# Patient Record
Sex: Female | Born: 1940 | Smoking: Former smoker
Health system: Southern US, Community
[De-identification: ages and names within clinical notes are randomized; demographics above are authoritative.]

## PROBLEM LIST (undated history)

## (undated) DIAGNOSIS — E119 Type 2 diabetes mellitus without complications: Secondary | ICD-10-CM

## (undated) DIAGNOSIS — Z860100 Personal history of colon polyps, unspecified: Secondary | ICD-10-CM

## (undated) DIAGNOSIS — T7840XA Allergy, unspecified, initial encounter: Secondary | ICD-10-CM

## (undated) DIAGNOSIS — K648 Other hemorrhoids: Secondary | ICD-10-CM

## (undated) DIAGNOSIS — K769 Liver disease, unspecified: Secondary | ICD-10-CM

## (undated) DIAGNOSIS — K5790 Diverticulosis of intestine, part unspecified, without perforation or abscess without bleeding: Secondary | ICD-10-CM

## (undated) DIAGNOSIS — K449 Diaphragmatic hernia without obstruction or gangrene: Secondary | ICD-10-CM

## (undated) DIAGNOSIS — R32 Unspecified urinary incontinence: Secondary | ICD-10-CM

## (undated) DIAGNOSIS — E0590 Thyrotoxicosis, unspecified without thyrotoxic crisis or storm: Secondary | ICD-10-CM

## (undated) DIAGNOSIS — M1990 Unspecified osteoarthritis, unspecified site: Secondary | ICD-10-CM

## (undated) DIAGNOSIS — E785 Hyperlipidemia, unspecified: Secondary | ICD-10-CM

## (undated) DIAGNOSIS — I10 Essential (primary) hypertension: Secondary | ICD-10-CM

## (undated) DIAGNOSIS — K862 Cyst of pancreas: Secondary | ICD-10-CM

## (undated) DIAGNOSIS — K219 Gastro-esophageal reflux disease without esophagitis: Secondary | ICD-10-CM

## (undated) DIAGNOSIS — E7800 Pure hypercholesterolemia, unspecified: Secondary | ICD-10-CM

## (undated) HISTORY — DX: Pure hypercholesterolemia, unspecified: E78.00

## (undated) HISTORY — DX: Allergy, unspecified, initial encounter: T78.40XA

## (undated) HISTORY — DX: Thyrotoxicosis, unspecified without thyrotoxic crisis or storm: E05.90

## (undated) HISTORY — DX: Personal history of colonic polyps: Z86.010

## (undated) HISTORY — DX: Type 2 diabetes mellitus without complications: E11.9

## (undated) HISTORY — DX: Gastro-esophageal reflux disease without esophagitis: K21.9

## (undated) HISTORY — DX: Essential (primary) hypertension: I10

## (undated) HISTORY — DX: Personal history of colon polyps, unspecified: Z86.0100

## (undated) HISTORY — DX: Liver disease, unspecified: K76.9

## (undated) HISTORY — DX: Other hemorrhoids: K64.8

## (undated) HISTORY — PX: ABDOMINAL HYSTERECTOMY: SHX81

## (undated) HISTORY — DX: Unspecified osteoarthritis, unspecified site: M19.90

## (undated) HISTORY — DX: Unspecified urinary incontinence: R32

## (undated) HISTORY — DX: Cyst of pancreas: K86.2

## (undated) HISTORY — DX: Hyperlipidemia, unspecified: E78.5

## (undated) HISTORY — DX: Diverticulosis of intestine, part unspecified, without perforation or abscess without bleeding: K57.90

## (undated) HISTORY — DX: Diaphragmatic hernia without obstruction or gangrene: K44.9

---

## 2015-07-10 ENCOUNTER — Ambulatory Visit: Payer: Self-pay | Admitting: Family Medicine

## 2015-08-03 ENCOUNTER — Other Ambulatory Visit (INDEPENDENT_AMBULATORY_CARE_PROVIDER_SITE_OTHER): Payer: Medicare Other

## 2015-08-03 ENCOUNTER — Encounter: Payer: Self-pay | Admitting: Internal Medicine

## 2015-08-03 ENCOUNTER — Ambulatory Visit (INDEPENDENT_AMBULATORY_CARE_PROVIDER_SITE_OTHER)
Admission: RE | Admit: 2015-08-03 | Discharge: 2015-08-03 | Disposition: A | Discharge status: Home / Self Care | Payer: Medicare Other | Source: Ambulatory Visit | Attending: Internal Medicine | Admitting: Internal Medicine

## 2015-08-03 ENCOUNTER — Ambulatory Visit (INDEPENDENT_AMBULATORY_CARE_PROVIDER_SITE_OTHER): Payer: Medicare Other | Admitting: Internal Medicine

## 2015-08-03 VITALS — BP 132/82 | HR 97 | Temp 98.7°F | Resp 12 | Ht 62.0 in | Wt 172.0 lb

## 2015-08-03 DIAGNOSIS — M79604 Pain in right leg: Secondary | ICD-10-CM

## 2015-08-03 DIAGNOSIS — R9389 Abnormal findings on diagnostic imaging of other specified body structures: Secondary | ICD-10-CM

## 2015-08-03 DIAGNOSIS — I10 Essential (primary) hypertension: Secondary | ICD-10-CM | POA: Diagnosis not present

## 2015-08-03 DIAGNOSIS — E785 Hyperlipidemia, unspecified: Secondary | ICD-10-CM

## 2015-08-03 DIAGNOSIS — E119 Type 2 diabetes mellitus without complications: Secondary | ICD-10-CM

## 2015-08-03 DIAGNOSIS — R938 Abnormal findings on diagnostic imaging of other specified body structures: Secondary | ICD-10-CM

## 2015-08-03 LAB — COMPREHENSIVE METABOLIC PANEL
ALT: 11 U/L (ref 0–35)
AST: 15 U/L (ref 0–37)
Albumin: 4.4 g/dL (ref 3.5–5.2)
Alkaline Phosphatase: 90 U/L (ref 39–117)
BUN: 16 mg/dL (ref 6–23)
CHLORIDE: 102 meq/L (ref 96–112)
CO2: 28 meq/L (ref 19–32)
CREATININE: 0.69 mg/dL (ref 0.40–1.20)
Calcium: 9.6 mg/dL (ref 8.4–10.5)
GFR: 87.8 mL/min (ref >60.00)
GLUCOSE: 112 mg/dL — AB (ref 70–99)
Potassium: 3.1 mEq/L — ABNORMAL LOW (ref 3.5–5.1)
SODIUM: 141 meq/L (ref 135–145)
Total Bilirubin: 0.7 mg/dL (ref 0.2–1.2)
Total Protein: 8.2 g/dL (ref 6.0–8.3)

## 2015-08-03 LAB — LIPID PANEL
CHOL/HDL RATIO: 3
Cholesterol: 156 mg/dL (ref 0–200)
HDL: 54.7 mg/dL (ref >39.00)
LDL CALC: 72 mg/dL (ref 0–99)
NonHDL: 101.13
TRIGLYCERIDES: 147 mg/dL (ref 0.0–149.0)
VLDL: 29.4 mg/dL (ref 0.0–40.0)

## 2015-08-03 LAB — HEMOGLOBIN A1C: HEMOGLOBIN A1C: 6.7 % — AB (ref 4.6–6.5)

## 2015-08-03 MED ORDER — SIMVASTATIN 20 MG PO TABS: 20.0000 mg | ORAL_TABLET | Freq: Every day | ORAL | Status: DC

## 2015-08-03 MED ORDER — AMLODIPINE BESYLATE 5 MG PO TABS: 5.0000 mg | ORAL_TABLET | Freq: Every day | ORAL | Status: DC

## 2015-08-03 MED ORDER — GLIPIZIDE ER 5 MG PO TB24
5.0000 mg | ORAL_TABLET | Freq: Every day | ORAL | Status: DC
Start: 2015-08-03 — End: 2016-04-10

## 2015-08-03 NOTE — Progress Notes (Signed)
Pre visit review using our clinic review tool, if applicable. No additional management support is needed unless otherwise documented below in the visit note. 

## 2015-08-03 NOTE — Progress Notes (Signed)
   Subjective:    Patient ID: Katrina Simmons, female    DOB: Feb 21, 1941, 75 y.o.   MRN: [id number]  HPI The patient is a new 75 YO female coming in with multiple concerns. Please see A/P for some status and treatment of chronic stable conditions. She does have a spot on her pancreas for which she gets MRI pancreas every year to ensure no growth. They have been watching it 7 years so far. Has not changed. No hypoglycemia or palpitations. She does also have right leg pain which started after a fall. She did not seek help after the fall as she was moving to the area. It hurts around the knee and also around the ankle. Able to walk on it and not swollen. Overall pain 7/10 at times. Using otc pain meds for it with limited success.   PMH, [facility], social history reviewed and updated.   Review of Systems  Constitutional: Negative for fever, chills, activity change, appetite change, fatigue and unexpected weight change.  HENT: Negative.   Eyes: Negative.   Respiratory: Negative for cough, chest tightness, shortness of breath and wheezing.   Cardiovascular: Negative for chest pain, palpitations and leg swelling.  Gastrointestinal: Negative for nausea, abdominal pain, diarrhea, constipation and abdominal distention.  Musculoskeletal: Positive for myalgias, arthralgias and gait problem.  Skin: Negative.   Neurological: Negative.   Psychiatric/Behavioral: Negative.       Objective:   Physical Exam  Constitutional: She is oriented to person, place, and time. She appears well-developed and well-nourished.  HENT:  Head: Normocephalic and atraumatic.  Eyes: EOM are normal.  Neck: Normal range of motion.  Cardiovascular: Normal rate and regular rhythm.   Pulmonary/Chest: Effort normal and breath sounds normal. No respiratory distress. She has no wheezes.  Abdominal: Soft. Bowel sounds are normal. She exhibits no distension. There is no tenderness. There is no rebound.  Musculoskeletal: She exhibits  tenderness.  Pain below the patella, also pain at the ankle and with manipulation of the ankle.   Neurological: She is alert and oriented to person, place, and time.  Skin: Skin is warm and dry.  Psychiatric: She has a normal mood and affect.   Filed Vitals:   08/03/15 0857  BP: 132/82  Pulse: 97  Temp: 98.7 F (37.1 C)  TempSrc: Oral  Resp: 12  Height:  (1.575 m)  Weight: 172 lb (78.019 kgCharlayne Vultaggio     Assessment & Plan:

## 2015-08-03 NOTE — Patient Instructions (Signed)
We will get the records and then order the imaging of the stomach.   We have done the x-rays of the ankle and knee today as well as the blood work.  We have sent in the refills and cleaned out your ears today.  Diabetes and Standards of Medical Care Diabetes is complicated. You may find that your diabetes team includes a dietitian, nurse, diabetes educator, eye doctor, and more. To help everyone know what is going on and to help you get the care you deserve, the following schedule of care was developed to help keep you on track. Below are the tests, exams, vaccines, medicines, education, and plans you will need. HbA1c test This test shows how well you have controlled your glucose over the past 2-3 months. It is used to see if your diabetes management plan needs to be adjusted.   It is performed at least 2 times a year if you are meeting treatment goals.  It is performed 4 times a year if therapy has changed or if you are not meeting treatment goals. Blood pressure test  This test is performed at every routine medical visit. The goal is less than 140/90 mm Hg for most people, but 130/80 mm Hg in some cases. Ask your health care provider about your goal. Dental exam  Follow up with the dentist regularly. Eye exam  If you are diagnosed with type 1 diabetes as a child, get an exam upon reaching the age of 50 years or older and having had diabetes for 3-5 years. Yearly eye exams are recommended after that initial eye exam.  If you are diagnosed with type 1 diabetes as an adult, get an exam within 5 years of diagnosis and then yearly.  If you are diagnosed with type 2 diabetes, get an exam as soon as possible after the diagnosis and then yearly. Foot care exam  Visual foot exams are performed at every routine medical visit. The exams check for cuts, injuries, or other problems with the feet.  You should have a complete foot exam performed every year. This exam includes an inspection of the  structure and skin of your feet, a check of the pulses in your feet, and a check of the sensation in your feet.  Type 1 diabetes: The first exam is performed 5 years after diagnosis.  Type 2 diabetes: The first exam is performed at the time of diagnosis.  Check your feet nightly for cuts, injuries, or other problems with your feet. Tell your health care provider if anything is not healing. Kidney function test (urine microalbumin)  This test is performed once a year.  Type 1 diabetes: The first test is performed 5 years after diagnosis.  Type 2 diabetes: The first test is performed at the time of diagnosis.  A serum creatinine and estimated glomerular filtration rate (eGFR) test is done once a year to assess the level of chronic kidney disease (CKD), if present. Lipid profile (cholesterol, HDL, LDL, triglycerides)  Performed every 5 years for most people.  The goal for LDL is less than 100 mg/dL. If you are at high risk, the goal is less than 70 mg/dL.  The goal for HDL is 40 mg/dL-50 mg/dL for men and 50 mg/dL-60 mg/dL for women. An HDL cholesterol of 60 mg/dL or higher gives some protection against heart disease.  The goal for triglycerides is less than 150 mg/dL. Immunizations  The flu (influenza) vaccine is recommended yearly for every person 51 months of age or  older who has diabetes.  The pneumonia (pneumococcal) vaccine is recommended for every person 56 years of age or older who has diabetes. Adults 75 years of age or older may receive the pneumonia vaccine as a series of two separate shots.  The hepatitis B vaccine is recommended for adults shortly after they have been diagnosed with diabetes.  The Tdap (tetanus, diphtheria, and pertussis) vaccine should be given:  According to normal childhood vaccination schedules, for children.  Every 10 years, for adults who have diabetes. Diabetes self-management education  Education is recommended at diagnosis and ongoing as  needed. Treatment plan  Your treatment plan is reviewed at every medical visit.   This information is not intended to replace advice given to you by your health care provider. Make sure you discuss any questions you have with your health care provider.   Document Released: 04/27/2009 Document Revised: 07/21/2014 Document Reviewed: 11/30/2012 Elsevier Interactive Patient Education [employer].

## 2015-08-05 DIAGNOSIS — E118 Type 2 diabetes mellitus with unspecified complications: Secondary | ICD-10-CM | POA: Insufficient documentation

## 2015-08-05 DIAGNOSIS — E1169 Type 2 diabetes mellitus with other specified complication: Secondary | ICD-10-CM | POA: Insufficient documentation

## 2015-08-05 DIAGNOSIS — R9389 Abnormal findings on diagnostic imaging of other specified body structures: Secondary | ICD-10-CM | POA: Insufficient documentation

## 2015-08-05 DIAGNOSIS — E119 Type 2 diabetes mellitus without complications: Secondary | ICD-10-CM

## 2015-08-05 DIAGNOSIS — E785 Hyperlipidemia, unspecified: Secondary | ICD-10-CM

## 2015-08-05 DIAGNOSIS — I10 Essential (primary) hypertension: Secondary | ICD-10-CM | POA: Insufficient documentation

## 2015-08-05 NOTE — Assessment & Plan Note (Signed)
Takes simvastatin and checking lipid panel today. Adjust as needed. No side effects.

## 2015-08-05 NOTE — Assessment & Plan Note (Signed)
Checking HgA1c, unclear why she is on glipizide and adjust as needed. Not on ACE-I, getting urine screening at next visit. Foot exam done today.

## 2015-08-05 NOTE — Assessment & Plan Note (Signed)
Will get records for full details but per her reports spot on her pancreas which is imaged annually in February. Once we get records we can order so that we have comparison. Unclear how long she should get imaging if no change. Does have diabetes.

## 2015-08-05 NOTE — Assessment & Plan Note (Signed)
BP at goal on amlodipine and checking labs today, if needed adjust. Unclear why she is not on ACE-I with her concurrent diabetes.

## 2015-08-06 ENCOUNTER — Telehealth: Payer: Self-pay

## 2015-08-07 ENCOUNTER — Encounter: Payer: Self-pay | Admitting: Internal Medicine

## 2015-08-10 ENCOUNTER — Telehealth: Payer: Self-pay | Admitting: Internal Medicine

## 2015-08-10 ENCOUNTER — Ambulatory Visit (INDEPENDENT_AMBULATORY_CARE_PROVIDER_SITE_OTHER): Payer: Medicare Other | Admitting: Internal Medicine

## 2015-08-10 ENCOUNTER — Encounter: Payer: Self-pay | Admitting: Internal Medicine

## 2015-08-10 VITALS — BP 130/70 | HR 103 | Temp 97.7°F | Resp 14 | Ht 62.0 in | Wt 177.0 lb

## 2015-08-10 DIAGNOSIS — M25569 Pain in unspecified knee: Secondary | ICD-10-CM | POA: Insufficient documentation

## 2015-08-10 DIAGNOSIS — R350 Frequency of micturition: Secondary | ICD-10-CM

## 2015-08-10 DIAGNOSIS — M25561 Pain in right knee: Secondary | ICD-10-CM

## 2015-08-10 MED ORDER — DICLOFENAC SODIUM 1 % TD GEL: 2.0000 g | Freq: Three times a day (TID) | TRANSDERMAL | Status: DC | PRN

## 2015-08-10 MED ORDER — OXYBUTYNIN CHLORIDE 5 MG PO TABS: 5.0000 mg | ORAL_TABLET | Freq: Two times a day (BID) | ORAL | Status: DC

## 2015-08-10 MED ORDER — MELOXICAM 7.5 MG PO TABS: 7.5000 mg | ORAL_TABLET | Freq: Every day | ORAL | Status: DC

## 2015-08-10 NOTE — Assessment & Plan Note (Signed)
Rx for meloxicam and voltaren gel to try for pain. Recent x-ray findings reviewed with her of arthritis. If she does not respond to NSAIDs may refer for knee injections. This may have been accelerated by her fall while moving.

## 2015-08-10 NOTE — Assessment & Plan Note (Signed)
Rx for oxybutynin to try for her symptoms. They do sound like urge incontinence. No indication for U/A today without symptoms of infection and symptoms with insidious onset.

## 2015-08-10 NOTE — Patient Instructions (Signed)
We have sent in the mobic  (meloxicam) which is a once daily medicine for arthritis pain.  We have also sent in oxybutynin for the bladder that you can take up to twice a day as needed.   Arthritis Arthritis means joint pain. It can also mean joint disease. A joint is a place where bones come together. People who have arthritis may have:  Red joints.  Swollen joints.  Stiff joints.  Warm joints.  A fever.  A feeling of being sick. HOME CARE Pay attention to any changes in your symptoms. Take these actions to help with your pain and swelling. Medicines  Take over-the-counter and prescription medicines only as told by your doctor.  Do not take aspirin for pain if your doctor says that you may have gout. Activities  Rest your joint if your doctor tells you to.  Avoid activities that make the pain worse.  Exercise your joint regularly as told by your doctor. Try doing exercises like:  Swimming.  Water aerobics.  Biking.  Walking. Joint Care  If your joint is swollen, keep it raised (elevated) if told by your doctor.  If your joint feels stiff in the morning, try taking a warm shower.  If you have diabetes, do not apply heat without asking your doctor.  If told, apply heat to the joint:  Put a towel between the joint and the hot pack or heating pad.  Leave the heat on the area for 20-30 minutes.  If told, apply ice to the joint:  Put ice in a plastic bag.  Place a towel between your skin and the bag.  Leave the ice on for 20 minutes, 2-3 times per day.  Keep all follow-up visits as told by your doctor. GET HELP IF:  The pain gets worse.  You have a fever. GET HELP RIGHT AWAY IF:  You have very bad pain in your joint.  You have swelling in your joint.  Your joint is red.  Many joints become painful and swollen.  You have very bad back pain.  Your leg is very weak.  You cannot control your pee (urine) or poop (stool).   This information is  not intended to replace advice given to you by your health care provider. Make sure you discuss any questions you have with your health care provider.   Document Released: 09/24/2009 Document Revised: 03/21/2015 Document Reviewed: 09/25/2014 Elsevier Interactive Patient Education [employer]

## 2015-08-10 NOTE — Telephone Encounter (Signed)
[facility] forward 90 pages to Dr.Crawford

## 2015-08-10 NOTE — Progress Notes (Signed)
   Subjective:    Patient ID: Katrina Simmons, female    DOB: May 11, 1941, 75 y.o.   MRN: [id number]  HPI The patient is a 75 YO female coming in for multiple concerns. She is still having the knee and ankle pain and wants something to try. She has taken naproxen which was not very helpful and bengay which was not helpful. She denies new injury or fall to the area. Rates it 4/10 and she is still able to be active.  Next concern is her urinary frequency. She is not having any burning. She sometimes does not make it to the bathroom. She has had it for several months and it is mild some days and more severe others. She is using the restroom multiple times per day and it is also waking her up at night time. No fevers or chills. No abdominal pain.  Review of Systems  Constitutional: Negative for fever, chills, activity change, appetite change, fatigue and unexpected weight change.  HENT: Negative.   Eyes: Negative.   Respiratory: Negative for cough, chest tightness, shortness of breath and wheezing.   Cardiovascular: Negative for chest pain, palpitations and leg swelling.  Gastrointestinal: Negative for nausea, abdominal pain, diarrhea, constipation and abdominal distention.  Genitourinary: Positive for urgency, frequency and enuresis. Negative for dysuria, flank pain, decreased urine volume and difficulty urinating.  Musculoskeletal: Positive for myalgias, arthralgias and gait problem.  Skin: Negative.   Neurological: Negative.   Psychiatric/Behavioral: Negative.       Objective:   Physical Exam  Constitutional: She is oriented to person, place, and time. She appears well-developed and well-nourished.  HENT:  Head: Normocephalic and atraumatic.  Eyes: EOM are normal.  Neck: Normal range of motion.  Cardiovascular: Normal rate and regular rhythm.   Pulmonary/Chest: Effort normal and breath sounds normal. No respiratory distress. She has no wheezes.  Abdominal: Soft. Bowel sounds are normal.  She exhibits no distension. There is no tenderness. There is no rebound.  Musculoskeletal: She exhibits tenderness.  Pain below the patella, also pain at the ankle and with manipulation of the ankle.   Neurological: She is alert and oriented to person, place, and time.  Skin: Skin is warm and dry.  Psychiatric: She has a normal mood and affect.   Filed Vitals:   08/10/15 0814  BP: 130/70  Pulse: 103  Temp: 97.7 F (36.5 C)  TempSrc: Oral  Resp: 14  Height:  (1.575 m)  Weight: 177 lb (80.287 kg)  SpO2: 98%      Assessment & Plan:

## 2015-08-10 NOTE — Progress Notes (Signed)
Pre visit review using our clinic review tool, if applicable. No additional management support is needed unless otherwise documented below in the visit note. 

## 2015-08-27 ENCOUNTER — Telehealth: Payer: Self-pay | Admitting: Internal Medicine

## 2015-08-27 DIAGNOSIS — K862 Cyst of pancreas: Secondary | ICD-10-CM

## 2015-08-27 NOTE — Telephone Encounter (Signed)
Notes not received. Patient will call to see what the hold up is.

## 2015-08-27 NOTE — Telephone Encounter (Signed)
Patient called to advise that her previous md office sent this information on 08/09/2015. States that they will fax it again today.

## 2015-08-27 NOTE — Telephone Encounter (Signed)
Is requesting call back.  States patient is really in need of surgery but Dr. Okey Dupre was waiting on notes from GI in [place].  Would like to know if notes have been received and what patient needs to do if not.

## 2015-08-27 NOTE — Telephone Encounter (Signed)
Pt called back to be sure we received the fax

## 2015-08-28 ENCOUNTER — Telehealth: Payer: Self-pay | Admitting: Internal Medicine

## 2015-08-28 NOTE — Telephone Encounter (Signed)
Records received. Please advise on referral for surgery, thanks.

## 2015-08-28 NOTE — Telephone Encounter (Signed)
What surgery is she waiting on? The records we were waiting on to see what if any imaging she needed for her stomach not a surgical clearance? The CT order was placed today.

## 2015-08-28 NOTE — Telephone Encounter (Signed)
Patient aware and will wait to hear about her appointment.

## 2015-08-28 NOTE — Telephone Encounter (Signed)
Rec'd from [facility] forward 90 pages to Dr. Okey Dupre

## 2015-08-29 ENCOUNTER — Telehealth: Payer: Self-pay | Admitting: Geriatric Medicine

## 2015-08-29 NOTE — Telephone Encounter (Signed)
Patient says she is supposed to have an MRI and not a CT Scan to check the spot on her pancreas. She wants you to change it. She said she called the office that is doing the procedure and they told her she usually has an MRI. Please advise, thanks.

## 2015-08-29 NOTE — Telephone Encounter (Signed)
I spoke with patient's daughter and clarified that the patient needs a CT, not an MRI.

## 2015-08-29 NOTE — Telephone Encounter (Signed)
Spoke with radiology and faxed them the report from prior imaging of the abdomen which was CT. She does not need an MRI. This is a non-emergent study and can be scheduled in the next month or so.

## 2015-08-31 ENCOUNTER — Inpatient Hospital Stay: Admission: RE | Admit: 2015-08-31 | Payer: Medicare Other | Source: Ambulatory Visit

## 2015-08-31 ENCOUNTER — Telehealth: Payer: Self-pay | Admitting: Internal Medicine

## 2015-08-31 ENCOUNTER — Telehealth: Payer: Self-pay | Admitting: Geriatric Medicine

## 2015-08-31 ENCOUNTER — Other Ambulatory Visit: Payer: Medicare Other

## 2015-08-31 DIAGNOSIS — K862 Cyst of pancreas: Secondary | ICD-10-CM

## 2015-08-31 NOTE — Telephone Encounter (Signed)
Needs to cancel CT scan.  She was referred to GI and it take a month and a half away.  States that mother thinks that's too long.  Is going to talk to mom again.  They might want to get records to go to another office. She will notify you on what she decides.

## 2015-08-31 NOTE — Telephone Encounter (Signed)
Patient's daughter said they were under the impression that after you got the records from [place] that you would refer them to a GI doctor. Her daughter is asking for a referral without another office visit. Please advise, thanks.

## 2015-08-31 NOTE — Telephone Encounter (Signed)
Left message on daughter's voice mail informing her of message below. I placed the records up front to be picked up.

## 2015-08-31 NOTE — Telephone Encounter (Signed)
Fine, would like her to pick up a copy of the records from our office to take with them to the GI visit since once they are put into the computer it takes some time for them to be available to other users. Referral placed but it could be a month or so until they can get in. Will cancel the imaging order since they do not intend to get done.

## 2015-09-03 NOTE — Telephone Encounter (Signed)
Ct was already canceled in the computer. I would advise that the month and a half away is not inappropriate as this spot has been present and unchanged now since 2009 and can wait for the appropriate imaging.

## 2015-09-05 NOTE — Telephone Encounter (Signed)
Left message for Katrina Simmons to call me back.

## 2015-09-08 DIAGNOSIS — N281 Cyst of kidney, acquired: Secondary | ICD-10-CM | POA: Diagnosis not present

## 2015-09-08 DIAGNOSIS — K7689 Other specified diseases of liver: Secondary | ICD-10-CM | POA: Diagnosis not present

## 2015-09-08 DIAGNOSIS — K8689 Other specified diseases of pancreas: Secondary | ICD-10-CM | POA: Diagnosis not present

## 2015-09-08 DIAGNOSIS — K449 Diaphragmatic hernia without obstruction or gangrene: Secondary | ICD-10-CM | POA: Diagnosis not present

## 2015-09-13 DIAGNOSIS — H2513 Age-related nuclear cataract, bilateral: Secondary | ICD-10-CM | POA: Diagnosis not present

## 2015-09-13 DIAGNOSIS — E119 Type 2 diabetes mellitus without complications: Secondary | ICD-10-CM | POA: Diagnosis not present

## 2015-09-13 DIAGNOSIS — H16223 Keratoconjunctivitis sicca, not specified as Sjogren's, bilateral: Secondary | ICD-10-CM | POA: Diagnosis not present

## 2015-09-14 DIAGNOSIS — Z86010 Personal history of colonic polyps: Secondary | ICD-10-CM | POA: Diagnosis not present

## 2015-09-14 DIAGNOSIS — E119 Type 2 diabetes mellitus without complications: Secondary | ICD-10-CM | POA: Diagnosis not present

## 2015-09-14 DIAGNOSIS — K862 Cyst of pancreas: Secondary | ICD-10-CM | POA: Diagnosis not present

## 2015-11-02 ENCOUNTER — Ambulatory Visit: Payer: Medicare Other | Admitting: Internal Medicine

## 2015-11-02 ENCOUNTER — Ambulatory Visit (INDEPENDENT_AMBULATORY_CARE_PROVIDER_SITE_OTHER): Payer: Medicare Other | Admitting: Internal Medicine

## 2015-11-02 ENCOUNTER — Encounter: Payer: Self-pay | Admitting: Internal Medicine

## 2015-11-02 VITALS — BP 134/64 | HR 99 | Temp 98.5°F | Resp 14 | Ht 62.0 in | Wt 175.0 lb

## 2015-11-02 DIAGNOSIS — M25561 Pain in right knee: Secondary | ICD-10-CM

## 2015-11-02 DIAGNOSIS — K649 Unspecified hemorrhoids: Secondary | ICD-10-CM | POA: Diagnosis not present

## 2015-11-02 MED ORDER — NAPROXEN 500 MG PO TABS: 500.0000 mg | ORAL_TABLET | Freq: Two times a day (BID) | ORAL | Status: DC

## 2015-11-02 MED ORDER — HYDROCORTISONE 2.5 % RE CREA: 1.0000 "application " | TOPICAL_CREAM | Freq: Two times a day (BID) | RECTAL | Status: AC

## 2015-11-02 NOTE — Assessment & Plan Note (Signed)
Rx for proctosol cream.

## 2015-11-02 NOTE — Patient Instructions (Signed)
We have sent in the cream for the hemorrhoids and the naprosyn for the pain. It is okay to keep using the voltaren cream as needed.   Come back in about 3 months so we can check on the diabetes and we will check your urine then to make sure the kidneys are not being affected by the diabetes.   Diabetes and Standards of Medical Care Diabetes is complicated. You may find that your diabetes team includes a dietitian, nurse, diabetes educator, eye doctor, and more. To help everyone know what is going on and to help you get the care you deserve, the following schedule of care was developed to help keep you on track. Below are the tests, exams, vaccines, medicines, education, and plans you will need. HbA1c test This test shows how well you have controlled your glucose over the past 2-3 months. It is used to see if your diabetes management plan needs to be adjusted.   It is performed at least 2 times a year if you are meeting treatment goals.  It is performed 4 times a year if therapy has changed or if you are not meeting treatment goals. Blood pressure test  This test is performed at every routine medical visit. The goal is less than 140/90 mm Hg for most people, but 130/80 mm Hg in some cases. Ask your health care provider about your goal. Dental exam  Follow up with the dentist regularly. Eye exam  If you are diagnosed with type 1 diabetes as a child, get an exam upon reaching the age of 40 years or older and having had diabetes for 3-5 years. Yearly eye exams are recommended after that initial eye exam.  If you are diagnosed with type 1 diabetes as an adult, get an exam within 5 years of diagnosis and then yearly.  If you are diagnosed with type 2 diabetes, get an exam as soon as possible after the diagnosis and then yearly. Foot care exam  Visual foot exams are performed at every routine medical visit. The exams check for cuts, injuries, or other problems with the feet.  You should have a  complete foot exam performed every year. This exam includes an inspection of the structure and skin of your feet, a check of the pulses in your feet, and a check of the sensation in your feet.  Type 1 diabetes: The first exam is performed 5 years after diagnosis.  Type 2 diabetes: The first exam is performed at the time of diagnosis.  Check your feet nightly for cuts, injuries, or other problems with your feet. Tell your health care provider if anything is not healing. Kidney function test (urine microalbumin)  This test is performed once a year.  Type 1 diabetes: The first test is performed 5 years after diagnosis.  Type 2 diabetes: The first test is performed at the time of diagnosis.  A serum creatinine and estimated glomerular filtration rate (eGFR) test is done once a year to assess the level of chronic kidney disease (CKD), if present. Lipid profile (cholesterol, HDL, LDL, triglycerides)  Performed every 5 years for most people.  The goal for LDL is less than 100 mg/dL. If you are at high risk, the goal is less than 70 mg/dL.  The goal for HDL is 40 mg/dL-50 mg/dL for men and 50 mg/dL-60 mg/dL for women. An HDL cholesterol of 60 mg/dL or higher gives some protection against heart disease.  The goal for triglycerides is less than 150 mg/dL. Immunizations  The flu (influenza) vaccine is recommended yearly for every person 60 months of age or older who has diabetes.  The pneumonia (pneumococcal) vaccine is recommended for every person 26 years of age or older who has diabetes. Adults 27 years of age or older may receive the pneumonia vaccine as a series of two separate shots.  The hepatitis B vaccine is recommended for adults shortly after they have been diagnosed with diabetes.  The Tdap (tetanus, diphtheria, and pertussis) vaccine should be given:  According to normal childhood vaccination schedules, for children.  Every 10 years, for adults who have diabetes. Diabetes  self-management education  Education is recommended at diagnosis and ongoing as needed. Treatment plan  Your treatment plan is reviewed at every medical visit.   This information is not intended to replace advice given to you by your health care provider. Make sure you discuss any questions you have with your health care provider.   Document Released: 04/27/2009 Document Revised: 07/21/2014 Document Reviewed: 11/30/2012 Elsevier Interactive Patient Education [employer].

## 2015-11-02 NOTE — Progress Notes (Signed)
Pre visit review using our clinic review tool, if applicable. No additional management support is needed unless otherwise documented below in the visit note. 

## 2015-11-02 NOTE — Assessment & Plan Note (Signed)
Rx for naproxen 500 mg BID prn.

## 2015-11-02 NOTE — Progress Notes (Signed)
   Subjective:    Patient ID: Katrina SchleinBarbara Rockers, female    DOB: Feb 11, 1941, 75 y.o.   MRN: [id number]  HPI The patient is a 75 YO female coming in for her arthritis and hemorrhoids. She did try the mobic but it bothers her stomach. She wants to go on naproxen which she has used in the past with good success. She also has hemorrhoids which are acting up now. She has used hydrocortisone cream in the past with good relief.  Review of Systems  Constitutional: Negative for fever, chills, activity change, appetite change, fatigue and unexpected weight change.  Respiratory: Negative for cough, chest tightness, shortness of breath and wheezing.   Cardiovascular: Negative for chest pain, palpitations and leg swelling.  Gastrointestinal: Negative for nausea, abdominal pain, diarrhea, constipation and abdominal distention.       Hemorrhoids  Genitourinary: Negative for dysuria, flank pain, decreased urine volume and difficulty urinating.  Musculoskeletal: Positive for myalgias, arthralgias and gait problem.  Skin: Negative.   Psychiatric/Behavioral: Negative.       Objective:   Physical Exam  Constitutional: She is oriented to person, place, and time. She appears well-developed and well-nourished.  HENT:  Head: Normocephalic and atraumatic.  Eyes: EOM are normal.  Neck: Normal range of motion.  Cardiovascular: Normal rate and regular rhythm.   Pulmonary/Chest: Effort normal and breath sounds normal. No respiratory distress. She has no wheezes.  Abdominal: Soft. Bowel sounds are normal. She exhibits no distension. There is no tenderness. There is no rebound.  Musculoskeletal: She exhibits tenderness.  Neurological: She is alert and oriented to person, place, and time.  Skin: Skin is warm and dry.  Psychiatric: She has a normal mood and affect.    Filed Vitals:   11/02/15 0805  BP: 134/64  Pulse: 99  Temp: 98.5 F (36.9 C)  TempSrc: Oral  Resp: 14  Height: 5\' 2"  (1.575 m)  Weight: 175 lb  (79.379 kg)  SpO2: 99%      Assessment & Plan:

## 2016-02-01 ENCOUNTER — Encounter: Payer: Self-pay | Admitting: Internal Medicine

## 2016-02-01 ENCOUNTER — Ambulatory Visit (INDEPENDENT_AMBULATORY_CARE_PROVIDER_SITE_OTHER): Payer: Medicare Other | Admitting: Internal Medicine

## 2016-02-01 ENCOUNTER — Other Ambulatory Visit (INDEPENDENT_AMBULATORY_CARE_PROVIDER_SITE_OTHER): Payer: Medicare Other

## 2016-02-01 VITALS — BP 142/80 | HR 66 | Temp 98.5°F | Resp 16 | Ht 62.0 in | Wt 182.0 lb

## 2016-02-01 DIAGNOSIS — J3089 Other allergic rhinitis: Secondary | ICD-10-CM

## 2016-02-01 DIAGNOSIS — I10 Essential (primary) hypertension: Secondary | ICD-10-CM

## 2016-02-01 DIAGNOSIS — E119 Type 2 diabetes mellitus without complications: Secondary | ICD-10-CM | POA: Diagnosis not present

## 2016-02-01 DIAGNOSIS — J309 Allergic rhinitis, unspecified: Secondary | ICD-10-CM | POA: Insufficient documentation

## 2016-02-01 LAB — HEMOGLOBIN A1C: HEMOGLOBIN A1C: 6.9 % — AB (ref 4.6–6.5)

## 2016-02-01 MED ORDER — NAPROXEN 500 MG PO TABS: 500.0000 mg | ORAL_TABLET | Freq: Two times a day (BID) | ORAL | Status: DC

## 2016-02-01 MED ORDER — FLUTICASONE PROPIONATE 50 MCG/ACT NA SUSP: 2.0000 | Freq: Every day | NASAL | Status: DC

## 2016-02-01 NOTE — Progress Notes (Signed)
   Subjective:    Patient ID: Katrina SchleinBarbara Goetting, female    DOB: [birth year], 75 y.o.   MRN: [id number]  HPI The patient is a 75 YO female coming in for follow up of her sugars (taking glipizide, denies low sugars, controlled without complications) and has acute complaint as well as leg swelling (off and on for years, worse in the last couple of days in her feet, no change to diet or exercise, not exercising) and new concern of sinus problems (taking nothing for it, going on for the last month off and on, pain in her ears, post-nasal drip, cough, no SOB, no fevers or chills).   Review of Systems  Constitutional: Negative for fever, chills, activity change, appetite change, fatigue and unexpected weight change.  HENT: Positive for congestion, ear pain, postnasal drip and rhinorrhea. Negative for ear discharge, sinus pressure, sore throat and trouble swallowing.   Eyes: Negative.   Respiratory: Positive for cough. Negative for chest tightness, shortness of breath and wheezing.   Cardiovascular: Positive for leg swelling. Negative for chest pain and palpitations.  Gastrointestinal: Negative for nausea, abdominal pain, diarrhea, constipation and abdominal distention.  Genitourinary: Negative for dysuria, flank pain, decreased urine volume and difficulty urinating.  Musculoskeletal: Positive for myalgias, arthralgias and gait problem.  Skin: Negative.   Neurological: Negative.   Psychiatric/Behavioral: Negative.       Objective:   Physical Exam  Constitutional: She is oriented to person, place, and time. She appears well-developed and well-nourished.  HENT:  Head: Normocephalic and atraumatic.  TMs normal bilateral, oropharynx with erythema and clear drainage  Eyes: EOM are normal.  Neck: Normal range of motion.  Cardiovascular: Normal rate and regular rhythm.   Pulmonary/Chest: Effort normal and breath sounds normal. No respiratory distress. She has no wheezes.  Abdominal: Soft. Bowel sounds  are normal. She exhibits no distension. There is no tenderness. There is no rebound.  Musculoskeletal: She exhibits edema.  1+ edema in bilateral feet  Lymphadenopathy:    She has no cervical adenopathy.  Neurological: She is alert and oriented to person, place, and time.  Skin: Skin is warm and dry.   Filed Vitals:   02/01/16 0800 02/01/16 0823  BP: 148/92 142/80  Pulse: 66   Temp: 98.5 F (36.9 C)   TempSrc: Oral   Resp: 16   Height: 5\' 2"  (1.575 m)   Weight: 182 lb (82.555 kg)   SpO2: 94%       Assessment & Plan:

## 2016-02-01 NOTE — Assessment & Plan Note (Signed)
Checking HgA1c today, taking glipizide and adjust as needed. No low sugars and not complicated, not on ACE-I or ARB.

## 2016-02-01 NOTE — Progress Notes (Signed)
Pre visit review using our clinic review tool, if applicable. No additional management support is needed unless otherwise documented below in the visit note. 

## 2016-02-01 NOTE — Assessment & Plan Note (Signed)
BP at goal on her amlodipine but she is having leg swelling which could be a side effect. She does not want to switch BP meds at this time.

## 2016-02-01 NOTE — Patient Instructions (Signed)
We have sent in flonase (fluticasone) to the pharmacy which is a nose spray for the allergies. Use 2 sprays in each nostril daily.  It is also okay to try zyrtec (cetirizine) 10 mg for the allergies which is an over the counter pill that you take daily.   We are checking the labs today. Work on decreasing the salt in your diet to help with the swelling.  DASH Eating Plan DASH stands for "Dietary Approaches to Stop Hypertension." The DASH eating plan is a healthy eating plan that has been shown to reduce high blood pressure (hypertension). Additional health benefits may include reducing the risk of type 2 diabetes mellitus, heart disease, and stroke. The DASH eating plan may also help with weight loss. WHAT DO I NEED TO KNOW ABOUT THE DASH EATING PLAN? For the DASH eating plan, you will follow these general guidelines:  Choose foods with a percent daily value for sodium of less than 5% (as listed on the food label).  Use salt-free seasonings or herbs instead of table salt or sea salt.  Check with your health care provider or pharmacist before using salt substitutes.  Eat lower-sodium products, often labeled as "lower sodium" or "no salt added."  Eat fresh foods.  Eat more vegetables, fruits, and low-fat dairy products.  Choose whole grains. Look for the word "whole" as the first word in the ingredient list.  Choose fish and skinless chicken or [place]key more often than red meat. Limit fish, poultry, and meat to 6 oz (170 g) each day.  Limit sweets, desserts, sugars, and sugary drinks.  Choose heart-healthy fats.  Limit cheese to 1 oz (28 g) per day.  Eat more home-cooked food and less restaurant, buffet, and fast food.  Limit fried foods.  Cook foods using methods other than frying.  Limit canned vegetables. If you do use them, rinse them well to decrease the sodium.  When eating at a restaurant, ask that your food be prepared with less salt, or no salt if possible. WHAT FOODS  CAN I EAT? Seek help from a dietitian for individual calorie needs. Grains Whole grain or whole wheat bread. Brown rice. Whole grain or whole wheat pasta. Quinoa, bulgur, and whole grain cereals. Low-sodium cereals. Corn or whole wheat flour tortillas. Whole grain cornbread. Whole grain crackers. Low-sodium crackers. Vegetables Fresh or frozen vegetables (raw, steamed, roasted, or grilled). Low-sodium or reduced-sodium tomato and vegetable juices. Low-sodium or reduced-sodium tomato sauce and paste. Low-sodium or reduced-sodium canned vegetables.  Fruits All fresh, canned (in natural juice), or frozen fruits. Meat and Other Protein Products Ground beef (85% or leaner), grass-fed beef, or beef trimmed of fat. Skinless chicken or [place]key. Ground chicken or [place]urkey. Pork trimmed of fat. All fish and seafood. Eggs. Dried beans, peas, or lentils. Unsalted nuts and seeds. Unsalted canned beans. Dairy Low-fat dairy products, such as skim or 1% milk, 2% or reduced-fat cheeses, low-fat ricotta or cottage cheese, or plain low-fat yogurt. Low-sodium or reduced-sodium cheeses. Fats and Oils Tub margarines without trans fats. Light or reduced-fat mayonnaise and salad dressings (reduced sodium). Avocado. Safflower, olive, or canola oils. Natural peanut or almond butter. Other Unsalted popcorn and pretzels. The items listed above may not be a complete list of recommended foods or beverages. Contact your dietitian for more options. WHAT FOODS ARE NOT RECOMMENDED? Grains White bread. White pasta. White rice. Refined cornbread. Bagels and croissants. Crackers that contain trans fat. Vegetables Creamed or fried vegetables. Vegetables in a cheese sauce. Regular canned vegetables.  Regular canned tomato sauce and paste. Regular tomato and vegetable juices. Fruits Dried fruits. Canned fruit in light or heavy syrup. Fruit juice. Meat and Other Protein Products Fatty cuts of meat. Ribs, chicken wings, bacon, sausage,  bologna, salami, chitterlings, fatback, hot dogs, bratwurst, and packaged luncheon meats. Salted nuts and seeds. Canned beans with salt. Dairy Whole or 2% milk, cream, half-and-half, and cream cheese. Whole-fat or sweetened yogurt. Full-fat cheeses or blue cheese. Nondairy creamers and whipped toppings. Processed cheese, cheese spreads, or cheese curds. Condiments Onion and garlic salt, seasoned salt, table salt, and sea salt. Canned and packaged gravies. Worcestershire sauce. Tartar sauce. Barbecue sauce. Teriyaki sauce. Soy sauce, including reduced sodium. Steak sauce. Fish sauce. Oyster sauce. Cocktail sauce. Horseradish. Ketchup and mustard. Meat flavorings and tenderizers. Bouillon cubes. Hot sauce. Tabasco sauce. Marinades. Taco seasonings. Relishes. Fats and Oils Butter, stick margarine, lard, shortening, ghee, and bacon fat. Coconut, palm kernel, or palm oils. Regular salad dressings. Other Pickles and olives. Salted popcorn and pretzels. The items listed above may not be a complete list of foods and beverages to avoid. Contact your dietitian for more information. WHERE CAN I FIND MORE INFORMATION? National Heart, Lung, and Blood Institute: travelstabloid.com   This information is not intended to replace advice given to you by your health care provider. Make sure you discuss any questions you have with your health care provider.   Document Released: 06/19/2011 Document Revised: 07/21/2014 Document Reviewed: 05/04/2013 Elsevier Interactive Patient Education [employer].

## 2016-02-01 NOTE — Assessment & Plan Note (Signed)
Rx for flonase and advised to start taking zyrtec. No indication for antibiotics or steroids on exam today.

## 2016-04-10 ENCOUNTER — Other Ambulatory Visit: Payer: Self-pay | Admitting: *Deleted

## 2016-04-10 MED ORDER — GLIPIZIDE ER 5 MG PO TB24: 5.0000 mg | ORAL_TABLET | Freq: Every day | ORAL | 1 refills | Status: DC

## 2016-04-10 MED ORDER — SIMVASTATIN 20 MG PO TABS: 20.0000 mg | ORAL_TABLET | Freq: Every day | ORAL | 1 refills | Status: DC

## 2016-04-25 ENCOUNTER — Ambulatory Visit: Payer: Medicare Other | Admitting: Internal Medicine

## 2016-04-25 ENCOUNTER — Other Ambulatory Visit (INDEPENDENT_AMBULATORY_CARE_PROVIDER_SITE_OTHER): Payer: Medicare Other

## 2016-04-25 ENCOUNTER — Encounter: Payer: Self-pay | Admitting: Internal Medicine

## 2016-04-25 ENCOUNTER — Ambulatory Visit (INDEPENDENT_AMBULATORY_CARE_PROVIDER_SITE_OTHER): Payer: Medicare Other | Admitting: Internal Medicine

## 2016-04-25 ENCOUNTER — Other Ambulatory Visit: Payer: Medicare Other

## 2016-04-25 VITALS — BP 158/78 | HR 92 | Temp 98.3°F | Resp 18 | Ht 62.0 in | Wt 180.4 lb

## 2016-04-25 DIAGNOSIS — R252 Cramp and spasm: Secondary | ICD-10-CM

## 2016-04-25 DIAGNOSIS — E119 Type 2 diabetes mellitus without complications: Secondary | ICD-10-CM | POA: Diagnosis not present

## 2016-04-25 LAB — HEMOGLOBIN A1C: Hgb A1c MFr Bld: 7 % — ABNORMAL HIGH (ref 4.6–6.5)

## 2016-04-25 NOTE — Assessment & Plan Note (Signed)
Checking HgA1c today and adjust as needed.  

## 2016-04-25 NOTE — Assessment & Plan Note (Signed)
She would like to trial off simvastatin for 2 weeks, if pain improved she will maintain off for 3 months and recheck lipid at visit. If no change to pain she will go back on the simvastatin. Reviewed old labs and no indication for change.

## 2016-04-25 NOTE — Progress Notes (Signed)
   Subjective:    Patient ID: Katrina SchleinBarbara Simmons, female    DOB: [birth year], 75 y.o.   MRN: [id number]  HPI The patient is a 75 YO female coming in for pains in her legs. She has heard from a friend that her cholesterol medicine can be causing some of her leg pain. She is having muscle pains in her legs most days. These are slightly worse from last visit. No change to diet or exercise since last visit. She has tried mustard over the counter and tylenol without good relief.   Review of Systems  Constitutional: Negative for activity change, appetite change, fatigue, fever and unexpected weight change.  Respiratory: Negative.   Cardiovascular: Negative.   Gastrointestinal: Negative.   Musculoskeletal: Positive for arthralgias and myalgias. Negative for back pain, gait problem and joint swelling.  Skin: Negative.   Neurological: Negative for dizziness, weakness, light-headedness and headaches.      Objective:   Physical Exam  Constitutional: She appears well-developed and well-nourished.  HENT:  Head: Normocephalic and atraumatic.  Eyes: EOM are normal.  Cardiovascular: Normal rate and regular rhythm.   Pulmonary/Chest: Effort normal. No respiratory distress. She has no wheezes. She has no rales.  Abdominal: Soft. She exhibits no distension. There is no tenderness.  Musculoskeletal: She exhibits no edema.  Mild pedal edema bilateral, unchanged from prior exam  Skin: Skin is warm and dry.   Vitals:   04/25/16 1529  BP: (!) 158/78  Pulse: 92  Resp: 18  Temp: 98.3 F (36.8 C)  TempSrc: Oral  SpO2: 98%  Weight: 180 lb 6.4 oz (81.8 kg)  Height: 5\' 2"  (1.575 m)      Assessment & Plan:

## 2016-04-25 NOTE — Patient Instructions (Addendum)
Stop taking the simvastatin medicine (also called zocor) for the next 2 weeks to see if your legs feel better.   If they feel better stay off the medicine until next visit.   If they do not feel any different starting taking the medicine again.   Don't forget to get the flu shot.

## 2016-04-25 NOTE — Progress Notes (Signed)
Pre visit review using our clinic review tool, if applicable. No additional management support is needed unless otherwise documented below in the visit note. 

## 2016-07-10 DIAGNOSIS — Z1231 Encounter for screening mammogram for malignant neoplasm of breast: Secondary | ICD-10-CM | POA: Diagnosis not present

## 2016-07-10 LAB — HM MAMMOGRAPHY

## 2016-07-11 ENCOUNTER — Encounter: Payer: Self-pay | Admitting: Internal Medicine

## 2016-07-11 NOTE — Progress Notes (Unsigned)
Results entered and sent to scan  

## 2016-07-16 ENCOUNTER — Other Ambulatory Visit: Payer: Self-pay | Admitting: Internal Medicine

## 2016-08-01 ENCOUNTER — Ambulatory Visit: Payer: Medicare Other | Admitting: Internal Medicine

## 2016-08-04 DIAGNOSIS — H04123 Dry eye syndrome of bilateral lacrimal glands: Secondary | ICD-10-CM | POA: Diagnosis not present

## 2016-08-12 DIAGNOSIS — H524 Presbyopia: Secondary | ICD-10-CM | POA: Diagnosis not present

## 2016-08-21 ENCOUNTER — Ambulatory Visit (INDEPENDENT_AMBULATORY_CARE_PROVIDER_SITE_OTHER): Payer: Medicare Other | Admitting: Internal Medicine

## 2016-08-21 ENCOUNTER — Encounter: Payer: Self-pay | Admitting: Internal Medicine

## 2016-08-21 DIAGNOSIS — H60393 Other infective otitis externa, bilateral: Secondary | ICD-10-CM | POA: Diagnosis not present

## 2016-08-21 DIAGNOSIS — H6090 Unspecified otitis externa, unspecified ear: Secondary | ICD-10-CM | POA: Insufficient documentation

## 2016-08-21 MED ORDER — FLUTICASONE PROPIONATE 50 MCG/ACT NA SUSP: 2.0000 | Freq: Every day | NASAL | 6 refills | Status: DC

## 2016-08-21 MED ORDER — NEOMYCIN-POLYMYXIN-HC 3.5-10000-1 OT SUSP: 3.0000 [drp] | Freq: Three times a day (TID) | OTIC | 0 refills | Status: DC

## 2016-08-21 NOTE — Patient Instructions (Signed)
We have sent in the ear drops to use. Use 3 drops in the ears 3 times per day for 4-5 days.   We have refilled the nose spray to keep using as well.

## 2016-08-21 NOTE — Progress Notes (Signed)
   Subjective:    Patient ID: Katrina SchleinBarbara Deland, female    DOB: 06/20/1941, 76 y.o.   MRN: [id number]  HPI The patient is a 76 YO female coming in for sinus pressure and drainage. Going on for about 1 week now. Not coughing much anymore. She is having some pain and pressure in her ears. She denies fevers or chills. Denies sick contacts. Mild sore throat. Some nasal drainage and she thinks she is using her nose spray. Overall stable to worsening with the ears.   Review of Systems  Constitutional: Negative for activity change, appetite change, chills, fatigue, fever and unexpected weight change.  HENT: Positive for congestion, ear discharge, ear pain, postnasal drip, rhinorrhea and sore throat. Negative for drooling, sinus pain, sinus pressure and trouble swallowing.   Eyes: Negative.   Respiratory: Positive for cough. Negative for chest tightness, shortness of breath and wheezing.   Cardiovascular: Negative.   Gastrointestinal: Negative.   Musculoskeletal: Negative.       Objective:   Physical Exam  Constitutional: She is oriented to person, place, and time. She appears well-developed and well-nourished. No distress.  HENT:  Head: Normocephalic and atraumatic.  Nose: Nose normal.  Otitis externa left and right, nose without crusting, oropharynx with redness and clear drainage.   Eyes: EOM are normal.  Neck: Normal range of motion. No JVD present.  Cardiovascular: Normal rate and regular rhythm.   Pulmonary/Chest: Effort normal and breath sounds normal. No respiratory distress. She has no wheezes. She has no rales.  Abdominal: Soft.  Lymphadenopathy:    She has no cervical adenopathy.  Neurological: She is alert and oriented to person, place, and time.  Skin: Skin is warm and dry.   Vitals:   08/21/16 1558  BP: (!) 158/90  Pulse: (!) 110  Temp: 98.3 F (36.8 C)  TempSrc: Oral  SpO2: 99%  Weight: 181 lb (82.1 kg)  Height: 5\' 2"  (1.575 m)      Assessment & Plan:

## 2016-08-21 NOTE — Assessment & Plan Note (Signed)
Rx for cortisporin ear drops. She will keep taking flonase which is refilled today and can add zyrtec for extra relief.

## 2016-08-21 NOTE — Progress Notes (Signed)
Pre visit review using our clinic review tool, if applicable. No additional management support is needed unless otherwise documented below in the visit note. 

## 2016-09-29 ENCOUNTER — Other Ambulatory Visit: Payer: Self-pay | Admitting: Internal Medicine

## 2016-10-01 ENCOUNTER — Telehealth: Payer: Self-pay | Admitting: Gastroenterology

## 2016-10-03 ENCOUNTER — Encounter: Payer: Self-pay | Admitting: Gastroenterology

## 2016-10-03 NOTE — Telephone Encounter (Signed)
Dr.Jacobs reviewed records and accepted patient for office visit to discuss colon

## 2016-10-31 DIAGNOSIS — R933 Abnormal findings on diagnostic imaging of other parts of digestive tract: Secondary | ICD-10-CM | POA: Diagnosis not present

## 2016-10-31 DIAGNOSIS — K862 Cyst of pancreas: Secondary | ICD-10-CM | POA: Diagnosis not present

## 2016-10-31 DIAGNOSIS — Z86010 Personal history of colonic polyps: Secondary | ICD-10-CM | POA: Diagnosis not present

## 2016-10-31 DIAGNOSIS — R1909 Other intra-abdominal and pelvic swelling, mass and lump: Secondary | ICD-10-CM | POA: Diagnosis not present

## 2016-11-11 ENCOUNTER — Encounter: Payer: Self-pay | Admitting: Gastroenterology

## 2016-11-11 ENCOUNTER — Ambulatory Visit (INDEPENDENT_AMBULATORY_CARE_PROVIDER_SITE_OTHER): Payer: Medicare Other | Admitting: Gastroenterology

## 2016-11-11 ENCOUNTER — Encounter (INDEPENDENT_AMBULATORY_CARE_PROVIDER_SITE_OTHER): Payer: Self-pay

## 2016-11-11 VITALS — BP 162/80 | HR 80 | Ht 62.0 in | Wt 178.6 lb

## 2016-11-11 DIAGNOSIS — Z86010 Personal history of colonic polyps: Secondary | ICD-10-CM

## 2016-11-11 NOTE — Patient Instructions (Signed)
We will get records sent from your previous gastroenterologist (in [place]) for review.  We already have the 2013 colonoscopy report, need previous colonoscopy reports and any associated pathology.

## 2016-11-11 NOTE — Progress Notes (Signed)
HPI: This is a  very pleasant 76 year old woman  who was referred to me by Myrlene Broker, *  to evaluate  personal history of colon polyps .    Chief complaint is personal history of colon polyps  She feels fine today. She is having no overt GI bleeding, no change in bowels, no overt weight loss.  She tells me she has had polyps in the past and he used to get colonoscopy about every 5 years.  Old Data Reviewed:  She had a colonoscopy in [place] February 2013 for "abdominal pain, history of polyps". This found diverticulosis and hemorrhoids. She was recommended to have repeat colonoscopy at five-year interval do to history of polyps. We do not have any records of previous polyp pathology here or any other previous colonoscopy reports.    Review of systems: Pertinent positive and negative review of systems were noted in the above HPI section. Complete review of systems was performed and was otherwise normal.   Past Medical History:  Diagnosis Date  . Allergy   . Arthritis   . Diabetes mellitus without complication (HCC)   . Diverticulosis   . GERD (gastroesophageal reflux disease)   . Hiatal hernia   . History of colon polyps   . Hypercholesteremia   . Hyperlipidemia   . Hypertension   . Hyperthyroidism   . Internal hemorrhoids   . Lesion of right lobe of liver   . Pancreatic cyst   . Urinary incontinence     Past Surgical History:  Procedure Laterality Date  . ABDOMINAL HYSTERECTOMY      Current Outpatient Prescriptions  Medication Sig Dispense Refill  . amLODipine (NORVASC) 5 MG tablet Take 1 tablet (5 mg total) by mouth daily. 90 tablet 1  . aspirin 81 MG tablet Take 81 mg by mouth daily.    . Cholecalciferol (D-3-5) 5000 units capsule Take 5,000 Units by mouth daily.    . diclofenac sodium (VOLTAREN) 1 % GEL Apply 2 g topically 3 (three) times daily as needed. 100 g 2  . fluticasone (FLONASE) 50 MCG/ACT nasal spray Place 2 sprays into both  nostrils daily. 16 g 6  . GLIPIZIDE XL 5 MG 24 hr tablet TAKE ONE TABLET BY MOUTH ONCE DAILY WITH  BREAKFAST 90 tablet 1  . hydrocortisone (PROCTOSOL HC) 2.5 % rectal cream Place 1 application rectally 2 (two) times daily. 30 g 11  . Menthol, Topical Analgesic, 2.5 % GEL Apply topically.    . naproxen (NAPROSYN) 500 MG tablet Take 1 tablet (500 mg total) by mouth 2 (two) times daily with a meal. 60 tablet 3  . oxybutynin (DITROPAN) 5 MG tablet Take 1 tablet (5 mg total) by mouth 2 (two) times daily. 60 tablet 3  . simvastatin (ZOCOR) 20 MG tablet Take 1 tablet (20 mg total) by mouth daily at 6 PM. 90 tablet 1  . ALREX 0.2 % SUSP as directed.  1   No current facility-administered medications for this visit.     Allergies as of 11/11/2016  . (No Known Allergies)    Family History  Problem Relation Age of Onset  . Stroke Mother   . Hypertension Mother   . Diabetes Mother   . Cancer Father     lung and prostate    Social History   Social History  . Marital status: Divorced    Spouse name: N/A  . Number of children: N/A  . Years of education: N/A   Occupational History  .  Not on file.   Social History Main Topics  . Smoking status: Former Smoker  . Smokeless tobacco: Never Used  . Alcohol use No  . Drug use: No  . Sexual activity: Not on file   Other Topics Concern  . Not on file   Social History Narrative  . No narrative on file     Physical Exam: BP (!) 162/80   Pulse 80   Ht  (1.575 m)   Wt 178 lb 9.6 oz (81 kg)   BMI 32.67 kg/m  Constitutional: generally well-appearing Psychiatric: alert and oriented x3 Eyes: extraocular movements intact Mouth: oral pharynx moist, no lesions Neck: supple no lymphadenopathy Cardiovascular: heart regular rate and rhythm Lungs: clear to auscultation bilaterally Abdomen: soft, nontender, nondistended, no obvious ascites, no peritoneal signs, normal bowel sounds Extremities: no lower extremity edema bilaterally Skin:  no lesions on visible extremities   Assessment and plan: 76 y.o. female with  Personal history of colon polyps  We discussed colon cancer screening, polyp surveillance guidelines and the evolution of those. I asked that we get records from her previous 2 or 3 colonoscopies before deciding on timing of her next colon polyp surveillance, colon cancer screening examination. Seems like she and her daughter understand the nature of the guidelines and when I get the records I will contact him with my recommendations. I see no reason for any further blood tests or imaging studies at this point   Please see the "Patient Instructions" section for addition details about the plan.   Rob Bunting, MD [facility] 11/11/2016, Games developer2 AM  Cc: Myrlene Broker, *

## 2016-11-14 DIAGNOSIS — K862 Cyst of pancreas: Secondary | ICD-10-CM | POA: Diagnosis not present

## 2016-11-14 DIAGNOSIS — E119 Type 2 diabetes mellitus without complications: Secondary | ICD-10-CM | POA: Diagnosis not present

## 2016-11-14 DIAGNOSIS — Z86010 Personal history of colonic polyps: Secondary | ICD-10-CM | POA: Diagnosis not present

## 2016-12-02 ENCOUNTER — Telehealth: Payer: Self-pay | Admitting: Gastroenterology

## 2016-12-02 NOTE — Telephone Encounter (Signed)
[facility] Dr. Richardson Do[facility]. Indications screening colonoscopy. Findings diverticulosis, internal hemorrhoid, 7 mm sigmoid polyp. 11 mm transverse colon polyp. Fair prep. 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  [place], Dr. Moses MannersJack Cole. Indications rectal and anal bleeding. Findings mild diverticulosis and internal hemorrhoids. She was recommended to have repeat colonoscopy in 5 years due to "patient being at higher risk of polyps that average"   [facility] [place] [facility] for "abdominal pain, history of polyps". This found diverticulosis and hemorrhoids. She was recommended to have repeat colonoscopy at five-year interval do to history of polyps. We do not have any records of previous polyp pathology here or any other previous colonoscopy reports.\     Please let her know that I received her previous colonoscopy reports and it does look like she had at least one polyp that was greater than 1 cm in the past. I recommend that she have repeat colonoscopy at this point for personal history of adenomatous colon polyps.

## 2016-12-03 NOTE — Telephone Encounter (Signed)
No voice mail no answer will try later

## 2016-12-04 NOTE — Telephone Encounter (Signed)
No answer and no voicemail. Letter mailed.

## 2017-01-07 ENCOUNTER — Other Ambulatory Visit: Payer: Self-pay | Admitting: Internal Medicine

## 2017-01-15 ENCOUNTER — Other Ambulatory Visit: Payer: Self-pay | Admitting: Internal Medicine

## 2017-03-30 ENCOUNTER — Telehealth: Payer: Self-pay | Admitting: Internal Medicine

## 2017-03-30 MED ORDER — SIMVASTATIN 20 MG PO TABS: ORAL_TABLET | ORAL | 0 refills | Status: DC

## 2017-03-30 MED ORDER — GLIPIZIDE ER 5 MG PO TB24: ORAL_TABLET | ORAL | 0 refills | Status: DC

## 2017-03-30 MED ORDER — AMLODIPINE BESYLATE 5 MG PO TABS: 5.0000 mg | ORAL_TABLET | Freq: Every day | ORAL | 0 refills | Status: DC

## 2017-03-30 NOTE — Telephone Encounter (Signed)
Pt called for a refill of her  amLODipine GLIPIZIDE XL 5 MG  simvastatin (ZOCOR) 20 MG tablet  CPE set up for 10/15 Please advise

## 2017-03-30 NOTE — Telephone Encounter (Signed)
Per office policy sent 30 day to local pharmacy until appt.../lmb  

## 2017-04-27 ENCOUNTER — Ambulatory Visit (INDEPENDENT_AMBULATORY_CARE_PROVIDER_SITE_OTHER): Payer: Medicare Other | Admitting: Internal Medicine

## 2017-04-27 ENCOUNTER — Encounter: Payer: Self-pay | Admitting: Internal Medicine

## 2017-04-27 ENCOUNTER — Ambulatory Visit (INDEPENDENT_AMBULATORY_CARE_PROVIDER_SITE_OTHER)
Admission: RE | Admit: 2017-04-27 | Discharge: 2017-04-27 | Disposition: A | Discharge status: Home / Self Care | Payer: Medicare Other | Source: Ambulatory Visit | Attending: Internal Medicine | Admitting: Internal Medicine

## 2017-04-27 ENCOUNTER — Other Ambulatory Visit (INDEPENDENT_AMBULATORY_CARE_PROVIDER_SITE_OTHER): Payer: Medicare Other

## 2017-04-27 VITALS — BP 130/70 | HR 100 | Temp 98.1°F | Ht 62.0 in | Wt 177.0 lb

## 2017-04-27 DIAGNOSIS — R05 Cough: Secondary | ICD-10-CM | POA: Diagnosis not present

## 2017-04-27 DIAGNOSIS — Z0000 Encounter for general adult medical examination without abnormal findings: Secondary | ICD-10-CM | POA: Diagnosis not present

## 2017-04-27 DIAGNOSIS — E785 Hyperlipidemia, unspecified: Secondary | ICD-10-CM | POA: Diagnosis not present

## 2017-04-27 DIAGNOSIS — E119 Type 2 diabetes mellitus without complications: Secondary | ICD-10-CM

## 2017-04-27 DIAGNOSIS — E1169 Type 2 diabetes mellitus with other specified complication: Secondary | ICD-10-CM

## 2017-04-27 LAB — COMPREHENSIVE METABOLIC PANEL
ALK PHOS: 101 U/L (ref 39–117)
ALT: 10 U/L (ref 0–35)
AST: 15 U/L (ref 0–37)
Albumin: 4.3 g/dL (ref 3.5–5.2)
BILIRUBIN TOTAL: 0.4 mg/dL (ref 0.2–1.2)
BUN: 15 mg/dL (ref 6–23)
CALCIUM: 9.2 mg/dL (ref 8.4–10.5)
CO2: 28 mEq/L (ref 19–32)
CREATININE: 0.79 mg/dL (ref 0.40–1.20)
Chloride: 102 mEq/L (ref 96–112)
GFR: 74.76 mL/min (ref >60.00)
Glucose, Bld: 157 mg/dL — ABNORMAL HIGH (ref 70–99)
Potassium: 3.3 mEq/L — ABNORMAL LOW (ref 3.5–5.1)
Sodium: 140 mEq/L (ref 135–145)
TOTAL PROTEIN: 7.9 g/dL (ref 6.0–8.3)

## 2017-04-27 LAB — CBC
HCT: 38.3 % (ref 36.0–46.0)
HEMOGLOBIN: 13.1 g/dL (ref 12.0–15.0)
MCHC: 34.3 g/dL (ref 30.0–36.0)
MCV: 87.8 fl (ref 78.0–100.0)
PLATELETS: 229 10*3/uL (ref 150.0–400.0)
RBC: 4.36 Mil/uL (ref 3.87–5.11)
RDW: 14.1 % (ref 11.5–15.5)
WBC: 6.2 10*3/uL (ref 4.0–10.5)

## 2017-04-27 LAB — LIPID PANEL
CHOLESTEROL: 171 mg/dL (ref 0–200)
HDL: 45.5 mg/dL (ref >39.00)
NonHDL: 125.77
TRIGLYCERIDES: 309 mg/dL — AB (ref 0.0–149.0)
Total CHOL/HDL Ratio: 4
VLDL: 61.8 mg/dL — ABNORMAL HIGH (ref 0.0–40.0)

## 2017-04-27 LAB — LDL CHOLESTEROL, DIRECT: Direct LDL: 78 mg/dL

## 2017-04-27 LAB — MICROALBUMIN / CREATININE URINE RATIO
CREATININE, U: 137.8 mg/dL
MICROALB/CREAT RATIO: 1.5 mg/g (ref 0.0–30.0)
Microalb, Ur: 2 mg/dL — ABNORMAL HIGH (ref 0.0–1.9)

## 2017-04-27 LAB — HEMOGLOBIN A1C: Hgb A1c MFr Bld: 6.8 % — ABNORMAL HIGH (ref 4.6–6.5)

## 2017-04-27 MED ORDER — MELOXICAM 15 MG PO TABS: 15.0000 mg | ORAL_TABLET | Freq: Every day | ORAL | 0 refills | Status: DC

## 2017-04-27 MED ORDER — GLIPIZIDE ER 5 MG PO TB24: ORAL_TABLET | ORAL | 0 refills | Status: DC

## 2017-04-27 MED ORDER — OFLOXACIN 0.3 % OT SOLN: 5.0000 [drp] | Freq: Every day | OTIC | 0 refills | Status: DC

## 2017-04-27 NOTE — Patient Instructions (Signed)
We are checking the labs and the chest x-ray today.  We have sent in the refills.   Health Maintenance, Female Adopting a healthy lifestyle and getting preventive care can go a long way to promote health and wellness. Talk with your health care provider about what schedule of regular examinations is right for you. This is a good chance for you to check in with your provider about disease prevention and staying healthy. In between checkups, there are plenty of things you can do on your own. Experts have done a lot of research about which lifestyle changes and preventive measures are most likely to keep you healthy. Ask your health care provider for more information. Weight and diet Eat a healthy diet  Be sure to include plenty of vegetables, fruits, low-fat dairy products, and lean protein.  Do not eat a lot of foods high in solid fats, added sugars, or salt.  Get regular exercise. This is one of the most important things you can do for your health. ? Most adults should exercise for at least 150 minutes each week. The exercise should increase your heart rate and make you sweat (moderate-intensity exercise). ? Most adults should also do strengthening exercises at least twice a week. This is in addition to the moderate-intensity exercise.  Maintain a healthy weight  Body mass index (BMI) is a measurement that can be used to identify possible weight problems. It estimates body fat based on height and weight. Your health care provider can help determine your BMI and help you achieve or maintain a healthy weight.  For females 38 years of age and older: ? A BMI below 18.5 is considered underweight. ? A BMI of 18.5 to 24.9 is normal. ? A BMI of 25 to 29.9 is considered overweight. ? A BMI of 30 and above is considered obese.  Watch levels of cholesterol and blood lipids  You should start having your blood tested for lipids and cholesterol at 78 years of age, then have this test every 5  years.  You may need to have your cholesterol levels checked more often if: ? Your lipid or cholesterol levels are high. ? You are older than 78 years of age. ? You are at high risk for heart disease.  Cancer screening Lung Cancer  Lung cancer screening is recommended for adults 68-11 years old who are at high risk for lung cancer because of a history of smoking.  A yearly low-dose CT scan of the lungs is recommended for people who: ? Currently smoke. ? Have quit within the past 15 years. ? Have at least a 30-pack-year history of smoking. A pack year is smoking an average of one pack of cigarettes a day for 1 year.  Yearly screening should continue until it has been 15 years since you quit.  Yearly screening should stop if you develop a health problem that would prevent you from having lung cancer treatment.  Breast Cancer  Practice breast self-awareness. This means understanding how your breasts normally appear and feel.  It also means doing regular breast self-exams. Let your health care provider know about any changes, no matter how small.  If you are in your 20s or 30s, you should have a clinical breast exam (CBE) by a health care provider every 1-3 years as part of a regular health exam.  If you are 83 or older, have a CBE every year. Also consider having a breast X-ray (mammogram) every year.  If you have a family history  of breast cancer, talk to your health care provider about genetic screening.  If you are at high risk for breast cancer, talk to your health care provider about having an MRI and a mammogram every year.  Breast cancer gene (BRCA) assessment is recommended for women who have family members with BRCA-related cancers. BRCA-related cancers include: ? Breast. ? Ovarian. ? Tubal. ? Peritoneal cancers.  Results of the assessment will determine the need for genetic counseling and BRCA1 and BRCA2 testing.  Cervical Cancer Your health care provider may  recommend that you be screened regularly for cancer of the pelvic organs (ovaries, uterus, and vagina). This screening involves a pelvic examination, including checking for microscopic changes to the surface of your cervix (Pap test). You may be encouraged to have this screening done every 3 years, beginning at age 21.  For women ages 30-65, health care providers may recommend pelvic exams and Pap testing every 3 years, or they may recommend the Pap and pelvic exam, combined with testing for human papilloma virus (HPV), every 5 years. Some types of HPV increase your risk of cervical cancer. Testing for HPV may also be done on women of any age with unclear Pap test results.  Other health care providers may not recommend any screening for nonpregnant women who are considered low risk for pelvic cancer and who do not have symptoms. Ask your health care provider if a screening pelvic exam is right for you.  If you have had past treatment for cervical cancer or a condition that could lead to cancer, you need Pap tests and screening for cancer for at least 20 years after your treatment. If Pap tests have been discontinued, your risk factors (such as having a new sexual partner) need to be reassessed to determine if screening should resume. Some women have medical problems that increase the chance of getting cervical cancer. In these cases, your health care provider may recommend more frequent screening and Pap tests.  Colorectal Cancer  This type of cancer can be detected and often prevented.  Routine colorectal cancer screening usually begins at 78 years of age and continues through 78 years of age.  Your health care provider may recommend screening at an earlier age if you have risk factors for colon cancer.  Your health care provider may also recommend using home test kits to check for hidden blood in the stool.  A small camera at the end of a tube can be used to examine your colon directly  (sigmoidoscopy or colonoscopy). This is done to check for the earliest forms of colorectal cancer.  Routine screening usually begins at age 50.  Direct examination of the colon should be repeated every 5-10 years through 78 years of age. However, you may need to be screened more often if early forms of precancerous polyps or small growths are found.  Skin Cancer  Check your skin from head to toe regularly.  Tell your health care provider about any new moles or changes in moles, especially if there is a change in a mole's shape or color.  Also tell your health care provider if you have a mole that is larger than the size of a pencil eraser.  Always use sunscreen. Apply sunscreen liberally and repeatedly throughout the day.  Protect yourself by wearing long sleeves, pants, a wide-brimmed hat, and sunglasses whenever you are outside.  Heart disease, diabetes, and high blood pressure  High blood pressure causes heart disease and increases the risk of stroke. High   blood pressure is more likely to develop in: ? People who have blood pressure in the high end of the normal range (130-139/85-89 mm Hg). ? People who are overweight or obese. ? People who are African American.  If you are 18-39 years of age, have your blood pressure checked every 3-5 years. If you are 40 years of age or older, have your blood pressure checked every year. You should have your blood pressure measured twice-once when you are at a hospital or clinic, and once when you are not at a hospital or clinic. Record the average of the two measurements. To check your blood pressure when you are not at a hospital or clinic, you can use: ? An automated blood pressure machine at a pharmacy. ? A home blood pressure monitor.  If you are between 55 years and 79 years old, ask your health care provider if you should take aspirin to prevent strokes.  Have regular diabetes screenings. This involves taking a blood sample to check your  fasting blood sugar level. ? If you are at a normal weight and have a low risk for diabetes, have this test once every three years after 78 years of age. ? If you are overweight and have a high risk for diabetes, consider being tested at a younger age or more often. Preventing infection Hepatitis B  If you have a higher risk for hepatitis B, you should be screened for this virus. You are considered at high risk for hepatitis B if: ? You were born in a country where hepatitis B is common. Ask your health care provider which countries are considered high risk. ? Your parents were born in a high-risk country, and you have not been immunized against hepatitis B (hepatitis B vaccine). ? You have HIV or AIDS. ? You use needles to inject street drugs. ? You live with someone who has hepatitis B. ? You have had sex with someone who has hepatitis B. ? You get hemodialysis treatment. ? You take certain medicines for conditions, including cancer, organ transplantation, and autoimmune conditions.  Hepatitis C  Blood testing is recommended for: ? Everyone born from 1945 through 1965. ? Anyone with known risk factors for hepatitis C.  Sexually transmitted infections (STIs)  You should be screened for sexually transmitted infections (STIs) including gonorrhea and chlamydia if: ? You are sexually active and are younger than 78 years of age. ? You are older than 78 years of age and your health care provider tells you that you are at risk for this type of infection. ? Your sexual activity has changed since you were last screened and you are at an increased risk for chlamydia or gonorrhea. Ask your health care provider if you are at risk.  If you do not have HIV, but are at risk, it may be recommended that you take a prescription medicine daily to prevent HIV infection. This is called pre-exposure prophylaxis (PrEP). You are considered at risk if: ? You are sexually active and do not regularly use condoms  or know the HIV status of your partner(s). ? You take drugs by injection. ? You are sexually active with a partner who has HIV.  Talk with your health care provider about whether you are at high risk of being infected with HIV. If you choose to begin PrEP, you should first be tested for HIV. You should then be tested every 3 months for as long as you are taking PrEP. Pregnancy  If you are   premenopausal and you may become pregnant, ask your health care provider about preconception counseling.  If you may become pregnant, take 400 to 800 micrograms (mcg) of folic acid every day.  If you want to prevent pregnancy, talk to your health care provider about birth control (contraception). Osteoporosis and menopause  Osteoporosis is a disease in which the bones lose minerals and strength with aging. This can result in serious bone fractures. Your risk for osteoporosis can be identified using a bone density scan.  If you are 41 years of age or older, or if you are at risk for osteoporosis and fractures, ask your health care provider if you should be screened.  Ask your health care provider whether you should take a calcium or vitamin D supplement to lower your risk for osteoporosis.  Menopause may have certain physical symptoms and risks.  Hormone replacement therapy may reduce some of these symptoms and risks. Talk to your health care provider about whether hormone replacement therapy is right for you. Follow these instructions at home:  Schedule regular health, dental, and eye exams.  Stay current with your immunizations.  Do not use any tobacco products including cigarettes, chewing tobacco, or electronic cigarettes.  If you are pregnant, do not drink alcohol.  If you are breastfeeding, limit how much and how often you drink alcohol.  Limit alcohol intake to no more than 1 drink per day for nonpregnant women. One drink equals 12 ounces of beer, 5 ounces of wine, or 1 ounces of hard  liquor.  Do not use street drugs.  Do not share needles.  Ask your health care provider for help if you need support or information about quitting drugs.  Tell your health care provider if you often feel depressed.  Tell your health care provider if you have ever been abused or do not feel safe at home. This information is not intended to replace advice given to you by your health care provider. Make sure you discuss any questions you have with your health care provider. Document Released: 01/13/2011 Document Revised: 12/06/2015 Document Reviewed: 04/03/2015 Elsevier Interactive Patient Education  [employer].

## 2017-04-27 NOTE — Progress Notes (Signed)
   Subjective:    Patient ID: Katrina Simmons, female    DOB: 09-21-1940, 76 y.o.   MRN: [id number]  HPI Here for medicare wellness, no new complaints. Please see A/P for status and treatment of chronic medical problems.   Diet: DM since diabetic Physical activity: sedentary Depression/mood screen: negative Hearing: intact to whispered voice Visual acuity: grossly normal with lens, performs annual eye exam @ [facility] ADLs: capable Fall risk: none Home safety: good Cognitive evaluation: intact to orientation, naming, recall and repetition EOL planning: adv directives discussed  I have personally reviewed and have noted 1. The patient's medical and social history - reviewed today no changes 2. Their use of alcohol, tobacco or illicit drugs 3. Their current medications and supplements 4. The patient's functional ability including ADL's, fall risks, home safety risks and hearing or visual impairment. 5. Diet and physical activities 6. Evidence for depression or mood disorders 7. Care team reviewed and updated (available in snapshot)  Review of Systems  Constitutional: Negative.   HENT: Negative.   Eyes: Negative.   Respiratory: Negative for cough, chest tightness and shortness of breath.   Cardiovascular: Negative for chest pain, palpitations and leg swelling.  Gastrointestinal: Negative for abdominal distention, abdominal pain, constipation, diarrhea, nausea and vomiting.  Musculoskeletal: Negative.   Skin: Negative.   Neurological: Negative.   Psychiatric/Behavioral: Negative.       Objective:   Physical Exam  Constitutional: She is oriented to person, place, and time. She appears well-developed and well-nourished.  HENT:  Head: Normocephalic and atraumatic.  Eyes: EOM are normal.  Neck: Normal range of motion.  Cardiovascular: Normal rate and regular rhythm.   Pulmonary/Chest: Effort normal and breath sounds normal. No respiratory distress. She has no wheezes. She  has no rales.  Abdominal: Soft. Bowel sounds are normal. She exhibits no distension. There is no tenderness. There is no rebound.  Musculoskeletal: She exhibits no edema.  Neurological: She is alert and oriented to person, place, and time. Coordination normal.  Skin: Skin is warm and dry.  See foot exam  Psychiatric: She has a normal mood and affect.   Vitals:   04/27/17 1547  BP: 130/70  Pulse: 100  Temp: 98.1 F (36.7 C)  TempSrc: Oral  SpO2: 98%  Weight: 177 lb (80.3 kg)  Height:  (1.575 m)      Assessment & Plan:

## 2017-04-28 ENCOUNTER — Other Ambulatory Visit: Payer: Self-pay | Admitting: Internal Medicine

## 2017-04-28 DIAGNOSIS — Z0000 Encounter for general adult medical examination without abnormal findings: Secondary | ICD-10-CM | POA: Insufficient documentation

## 2017-04-28 MED ORDER — DOXYCYCLINE HYCLATE 100 MG PO TABS
100.0000 mg | ORAL_TABLET | Freq: Two times a day (BID) | ORAL | 0 refills | Status: DC
Start: 2017-04-28 — End: 2018-02-18

## 2017-04-28 MED ORDER — GUAIFENESIN-DM 100-10 MG/5ML PO SYRP: 5.0000 mL | ORAL_SOLUTION | ORAL | 0 refills | Status: DC | PRN

## 2017-04-28 NOTE — Assessment & Plan Note (Signed)
Checking labs, declines immunizations today including flu, tetanus, pneumonia, shingrix. Counseled about bone density scan and she declines today. Eye exam is up to date and we do not have those records. Counseled about sun safety and mole surveillance. Given 10 year screening recommendations.

## 2017-04-28 NOTE — Assessment & Plan Note (Signed)
Goal LDL <100. She is on simvastatin 20 mg daily and adjust as needed. Checking lipid panel today.

## 2017-04-28 NOTE — Assessment & Plan Note (Signed)
Foot exam done, checking microalbumin to creatinine ratio. Checking HgA1c, taking glipizide and denies low sugars. Not complicated. Adjust as needed.

## 2017-04-29 ENCOUNTER — Telehealth: Payer: Self-pay | Admitting: Internal Medicine

## 2017-04-29 NOTE — Telephone Encounter (Signed)
Patient states that Dr. Okey Duprerawford prescribed ear drops for her that she can not afford.  Would like to know if Dr. Okey Duprerawford could [facility] [address]

## 2017-04-30 NOTE — Telephone Encounter (Signed)
Are they not covered or just expensive? This is a generic medication and should not be expensive.

## 2017-04-30 NOTE — Telephone Encounter (Signed)
Patient informed the antibiotic that was sent in for the pneumonia would help with her ears

## 2017-06-30 DIAGNOSIS — M25552 Pain in left hip: Secondary | ICD-10-CM | POA: Diagnosis not present

## 2017-07-29 ENCOUNTER — Other Ambulatory Visit: Payer: Self-pay | Admitting: Internal Medicine

## 2017-08-12 ENCOUNTER — Other Ambulatory Visit: Payer: Self-pay | Admitting: Internal Medicine

## 2017-08-19 ENCOUNTER — Other Ambulatory Visit: Payer: Self-pay | Admitting: Family

## 2017-08-20 ENCOUNTER — Telehealth: Payer: Self-pay | Admitting: Internal Medicine

## 2017-08-20 MED ORDER — SIMVASTATIN 20 MG PO TABS: ORAL_TABLET | ORAL | 1 refills | Status: DC

## 2017-08-20 NOTE — Telephone Encounter (Signed)
Copied from CRM [id number]. Topic: Quick Communication - Rx Refill/Question >[phone]> Aug 20, 2017  3:54 PM Louie BunPalacios Medina, Rosey Batheresa D wrote: Medication: simvastatin (ZOCOR) 20 MG tablet   Has the patient contacted their pharmacy? Yes   (Agent: If no, request that the patient contact the pharmacy for the refill.)   Preferred Pharmacy (with phone number or street name): [facility] [address]- [place], [place] - [id number]   Agent: Please be advised that RX refills may take up to 3 business days. We ask that you follow-up with your pharmacy.

## 2017-09-07 DIAGNOSIS — Z1231 Encounter for screening mammogram for malignant neoplasm of breast: Secondary | ICD-10-CM | POA: Diagnosis not present

## 2017-09-07 LAB — HM MAMMOGRAPHY

## 2017-09-09 ENCOUNTER — Encounter: Payer: Self-pay | Admitting: Internal Medicine

## 2017-09-09 NOTE — Progress Notes (Unsigned)
Abstracted and sent to scan  

## 2017-12-19 DIAGNOSIS — J069 Acute upper respiratory infection, unspecified: Secondary | ICD-10-CM | POA: Diagnosis not present

## 2018-02-18 ENCOUNTER — Ambulatory Visit: Payer: Medicare Other | Admitting: Internal Medicine

## 2018-02-18 ENCOUNTER — Encounter: Payer: Self-pay | Admitting: Internal Medicine

## 2018-02-18 DIAGNOSIS — H6123 Impacted cerumen, bilateral: Secondary | ICD-10-CM | POA: Insufficient documentation

## 2018-02-18 MED ORDER — SIMVASTATIN 20 MG PO TABS: ORAL_TABLET | ORAL | 3 refills | Status: DC

## 2018-02-18 MED ORDER — AMLODIPINE BESYLATE 5 MG PO TABS: 5.0000 mg | ORAL_TABLET | Freq: Every day | ORAL | 3 refills | Status: DC

## 2018-02-18 MED ORDER — GLIPIZIDE ER 5 MG PO TB24: ORAL_TABLET | ORAL | 3 refills | Status: DC

## 2018-02-18 NOTE — Progress Notes (Signed)
   Subjective:    Patient ID: Katrina Simmons, female    DOB: 10-21-1940, 77 y.o.   MRN: [id number]  HPI The patient is a 77 YO female coming in for hearing change. She is concerned about having wax in her ears. This has happened before.She denies nasal congestion or drainage. No fevers or chills. Denies SOB or cough. She denies chest pains. No headaches or migraines. Started a couple weeks ago. Overall is worsening.    Review of Systems  Constitutional: Negative.   HENT: Positive for hearing loss. Negative for congestion, dental problem, ear discharge, ear pain, postnasal drip, sinus pressure, sinus pain, sneezing, tinnitus, trouble swallowing and voice change.   Eyes: Negative.   Respiratory: Negative for cough, chest tightness and shortness of breath.   Cardiovascular: Negative for chest pain, palpitations and leg swelling.  Gastrointestinal: Negative for abdominal distention, abdominal pain, constipation, diarrhea, nausea and vomiting.  Musculoskeletal: Negative.   Skin: Negative.   Neurological: Negative.       Objective:   Physical Exam  Constitutional: She is oriented to person, place, and time. She appears well-developed and well-nourished.  HENT:  Head: Normocephalic and atraumatic.  Ears blocked with wax, after disimpaction TM normal.   Eyes: EOM are normal.  Neck: Normal range of motion.  Cardiovascular: Normal rate and regular rhythm.  Pulmonary/Chest: Effort normal and breath sounds normal. No respiratory distress. She has no wheezes. She has no rales.  Musculoskeletal: She exhibits no edema.  Neurological: She is alert and oriented to person, place, and time. Coordination normal.  Skin: Skin is warm and dry.   Vitals:   02/18/18 1513  BP: 128/78  Pulse: (!) 111  Temp: 98.2 F (36.8 C)  TempSrc: Oral  SpO2: 98%  Weight: 175 lb (79.4 kg)  Height: 5\' 2"  (1.575 m)      Assessment & Plan:

## 2018-02-18 NOTE — Patient Instructions (Signed)
We have cleaned out the ears and sent in refills.

## 2018-02-18 NOTE — Assessment & Plan Note (Signed)
Ears disimpacted today in the office and hearing was normalized after this. No infection and no indication for antibiotics.

## 2018-09-01 ENCOUNTER — Telehealth: Payer: Self-pay | Admitting: Internal Medicine

## 2018-09-01 NOTE — Telephone Encounter (Signed)
Tried to call patient. No answer and unable to leave a message.

## 2018-09-01 NOTE — Telephone Encounter (Signed)
Copied from CRM [id number]. Topic: Referral - Request for Referral >> Sep 01, 2018 10:40 AM Percival Spanish wrote:     Pt has contacted the office and they told her she would need a referral    Has patient seen PCP for this complaint    yes    Referral for which specialty  [facility] rheumatologist   Preferred provider/office  rheumatologist  Reason for referral   arthritis

## 2018-09-01 NOTE — Telephone Encounter (Signed)
Can you schedule patient an office visit to discuss. Thank you

## 2018-09-01 NOTE — Telephone Encounter (Signed)
Has not had well visit since 2018 and should schedule this for referral.

## 2019-02-11 ENCOUNTER — Telehealth: Payer: Self-pay | Admitting: Internal Medicine

## 2019-02-11 MED ORDER — AMLODIPINE BESYLATE 5 MG PO TABS: 5.0000 mg | ORAL_TABLET | Freq: Every day | ORAL | 0 refills | Status: DC

## 2019-02-11 NOTE — Telephone Encounter (Signed)
Medication Refill - Medication:  amLODipine (NORVASC) 5 MG tablet   Has the patient contacted their pharmacy? Yes. Advised to call.  Preferred Pharmacy (with phone number or street name):  [facility] [address], [place] [phone] (Phone) [phone] (Fax)   Agent: Please be advised that RX refills may take up to 3 business days. We ask that you follow-up with your pharmacy.

## 2019-02-11 NOTE — Telephone Encounter (Signed)
Sent needs visit for anymore refills

## 2019-03-08 ENCOUNTER — Other Ambulatory Visit (INDEPENDENT_AMBULATORY_CARE_PROVIDER_SITE_OTHER): Payer: Medicare Other

## 2019-03-08 ENCOUNTER — Other Ambulatory Visit: Payer: Self-pay

## 2019-03-08 ENCOUNTER — Ambulatory Visit (INDEPENDENT_AMBULATORY_CARE_PROVIDER_SITE_OTHER): Payer: Medicare Other | Admitting: Internal Medicine

## 2019-03-08 ENCOUNTER — Encounter: Payer: Self-pay | Admitting: Internal Medicine

## 2019-03-08 VITALS — BP 144/90 | HR 109 | Temp 98.0°F | Ht 62.0 in | Wt 173.0 lb

## 2019-03-08 DIAGNOSIS — R9389 Abnormal findings on diagnostic imaging of other specified body structures: Secondary | ICD-10-CM

## 2019-03-08 DIAGNOSIS — E1169 Type 2 diabetes mellitus with other specified complication: Secondary | ICD-10-CM | POA: Diagnosis not present

## 2019-03-08 DIAGNOSIS — E119 Type 2 diabetes mellitus without complications: Secondary | ICD-10-CM

## 2019-03-08 DIAGNOSIS — E118 Type 2 diabetes mellitus with unspecified complications: Secondary | ICD-10-CM | POA: Diagnosis not present

## 2019-03-08 DIAGNOSIS — E785 Hyperlipidemia, unspecified: Secondary | ICD-10-CM

## 2019-03-08 LAB — LIPID PANEL
Cholesterol: 149 mg/dL (ref 0–200)
HDL: 53.4 mg/dL (ref >39.00)
LDL Cholesterol: 60 mg/dL (ref 0–99)
NonHDL: 95.81
Total CHOL/HDL Ratio: 3
Triglycerides: 181 mg/dL — ABNORMAL HIGH (ref 0.0–149.0)
VLDL: 36.2 mg/dL (ref 0.0–40.0)

## 2019-03-08 LAB — CBC
HCT: 40.5 % (ref 36.0–46.0)
Hemoglobin: 13.3 g/dL (ref 12.0–15.0)
MCHC: 33 g/dL (ref 30.0–36.0)
MCV: 88 fl (ref 78.0–100.0)
Platelets: 234 K/uL (ref 150.0–400.0)
RBC: 4.6 Mil/uL (ref 3.87–5.11)
RDW: 14.1 % (ref 11.5–15.5)
WBC: 7.6 K/uL (ref 4.0–10.5)

## 2019-03-08 LAB — COMPREHENSIVE METABOLIC PANEL WITH GFR
ALT: 11 U/L (ref 0–35)
AST: 17 U/L (ref 0–37)
Albumin: 4.6 g/dL (ref 3.5–5.2)
Alkaline Phosphatase: 96 U/L (ref 39–117)
BUN: 13 mg/dL (ref 6–23)
CO2: 26 meq/L (ref 19–32)
Calcium: 9.5 mg/dL (ref 8.4–10.5)
Chloride: 100 meq/L (ref 96–112)
Creatinine, Ser: 0.69 mg/dL (ref 0.40–1.20)
GFR: 81.84 mL/min
Glucose, Bld: 153 mg/dL — ABNORMAL HIGH (ref 70–99)
Potassium: 3.6 meq/L (ref 3.5–5.1)
Sodium: 136 meq/L (ref 135–145)
Total Bilirubin: 0.5 mg/dL (ref 0.2–1.2)
Total Protein: 8.1 g/dL (ref 6.0–8.3)

## 2019-03-08 LAB — HEMOGLOBIN A1C: Hgb A1c MFr Bld: 7.3 % — ABNORMAL HIGH (ref 4.6–6.5)

## 2019-03-08 NOTE — Progress Notes (Signed)
   Subjective:   Patient ID: Katrina Simmons, female    DOB: [birth year], 78 y.o.   MRN: [id number]  HPI The patient is an 78 YO female coming in for concerns about sugars. She has had orthopedics and they were going to give her a steroid injection and sugar was 275 so they would not give her the shot. She is concerned about this as her sugars have been fairly stable for a long time. She denies missing or out of meds. Does not check sugars at home. Denies nausea or vomiting or abdominal pain. 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 Weight is stable and not decreasing recently.   Review of Systems  Constitutional: Negative.   HENT: Negative.   Eyes: Negative.   Respiratory: Negative for cough, chest tightness and shortness of breath.   Cardiovascular: Negative for chest pain, palpitations and leg swelling.  Gastrointestinal: Negative for abdominal distention, abdominal pain, constipation, diarrhea, nausea and vomiting.  Musculoskeletal: Positive for arthralgias.  Skin: Negative.   Neurological: Negative.   Psychiatric/Behavioral: Negative.     Objective:  Physical Exam Constitutional:      Appearance: She is well-developed.  HENT:     Head: Normocephalic and atraumatic.  Neck:     Musculoskeletal: Normal range of motion.  Cardiovascular:     Rate and Rhythm: Normal rate and regular rhythm.  Pulmonary:     Effort: Pulmonary effort is normal. No respiratory distress.     Breath sounds: Normal breath sounds. No wheezing or rales.  Abdominal:     General: Bowel sounds are normal. There is no distension.     Palpations: Abdomen is soft.     Tenderness: There is no abdominal tenderness. There is no rebound.  Musculoskeletal:        General: Tenderness present.  Skin:    General: Skin is warm and dry.  Neurological:     Mental Status: She is alert and oriented to person, place, and time.     Coordination: Coordination normal.     Vitals:   03/08/19 1544  BP: (!) 144/90  Pulse: (!) 109  Temp: 98 F (36.7 C)  TempSrc: Oral  SpO2: 99%  Weight: 173 lb (78.5 kg)  Height: 5\' 2"  (1.575 m)    Assessment & Plan:  Visit time 25 minutes: greater than 50% of that time was spent in face to face counseling and coordination of care with the patient: counseled about the pancreatic lesion and imaging as well as diabetes and control and potential need for glucose monitoring based on labs

## 2019-03-08 NOTE — Patient Instructions (Addendum)
We will check the labs today. If the diabetes is a lot worse we will order a scan of the abdomen to look at the pancreas.  Diabetes Mellitus and Nutrition, Adult When you have diabetes (diabetes mellitus), it is very important to have healthy eating habits because your blood sugar (glucose) levels are greatly affected by what you eat and drink. Eating healthy foods in the appropriate amounts, at about the same times every day, can help you:  Control your blood glucose.  Lower your risk of heart disease.  Improve your blood pressure.  Reach or maintain a healthy weight. Every person with diabetes is different, and each person has different needs for a meal plan. Your health care provider may recommend that you work with a diet and nutrition specialist (dietitian) to make a meal plan that is best for you. Your meal plan may vary depending on factors such as:  The calories you need.  The medicines you take.  Your weight.  Your blood glucose, blood pressure, and cholesterol levels.  Your activity level.  Other health conditions you have, such as heart or kidney disease. How do carbohydrates affect me? Carbohydrates, also called carbs, affect your blood glucose level more than any other type of food. Eating carbs naturally raises the amount of glucose in your blood. Carb counting is a method for keeping track of how many carbs you eat. Counting carbs is important to keep your blood glucose at a healthy level, especially if you use insulin or take certain oral diabetes medicines. It is important to know how many carbs you can safely have in each meal. This is different for every person. Your dietitian can help you calculate how many carbs you should have at each meal and for each snack. Foods that contain carbs include:  Bread, cereal, rice, pasta, and crackers.  Potatoes and corn.  Peas, beans, and lentils.  Milk and yogurt.  Fruit and juice.  Desserts, such as cakes, cookies, ice  cream, and candy. How does alcohol affect me? Alcohol can cause a sudden decrease in blood glucose (hypoglycemia), especially if you use insulin or take certain oral diabetes medicines. Hypoglycemia can be a life-threatening condition. Symptoms of hypoglycemia (sleepiness, dizziness, and confusion) are similar to symptoms of having too much alcohol. If your health care provider says that alcohol is safe for you, follow these guidelines:  Limit alcohol intake to no more than 1 drink per day for nonpregnant women and 2 drinks per day for men. One drink equals 12 oz of beer, 5 oz of wine, or 1 oz of hard liquor.  Do not drink on an empty stomach.  Keep yourself hydrated with water, diet soda, or unsweetened iced tea.  Keep in mind that regular soda, juice, and other mixers may contain a lot of sugar and must be counted as carbs. What are tips for following this plan?  Reading food labels  Start by checking the serving size on the "Nutrition Facts" label of packaged foods and drinks. The amount of calories, carbs, fats, and other nutrients listed on the label is based on one serving of the item. Many items contain more than one serving per package.  Check the total grams (g) of carbs in one serving. You can calculate the number of servings of carbs in one serving by dividing the total carbs by 15. For example, if a food has 30 g of total carbs, it would be equal to 2 servings of carbs.  Check the number  of grams (g) of saturated and trans fats in one serving. Choose foods that have low or no amount of these fats.  Check the number of milligrams (mg) of salt (sodium) in one serving. Most people should limit total sodium intake to less than 2,300 mg per day.  Always check the nutrition information of foods labeled as "low-fat" or "nonfat". These foods may be higher in added sugar or refined carbs and should be avoided.  Talk to your dietitian to identify your daily goals for nutrients listed on  the label. Shopping  Avoid buying canned, premade, or processed foods. These foods tend to be high in fat, sodium, and added sugar.  Shop around the outside edge of the grocery store. This includes fresh fruits and vegetables, bulk grains, fresh meats, and fresh dairy. Cooking  Use low-heat cooking methods, such as baking, instead of high-heat cooking methods like deep frying.  Cook using healthy oils, such as olive, canola, or sunflower oil.  Avoid cooking with butter, cream, or high-fat meats. Meal planning  Eat meals and snacks regularly, preferably at the same times every day. Avoid going long periods of time without eating.  Eat foods high in fiber, such as fresh fruits, vegetables, beans, and whole grains. Talk to your dietitian about how many servings of carbs you can eat at each meal.  Eat 4-6 ounces (oz) of lean protein each day, such as lean meat, chicken, fish, eggs, or tofu. One oz of lean protein is equal to: ? 1 oz of meat, chicken, or fish. ? 1 egg. ?  cup of tofu.  Eat some foods each day that contain healthy fats, such as avocado, nuts, seeds, and fish. Lifestyle  Check your blood glucose regularly.  Exercise regularly as told by your health care provider. This may include: ? 150 minutes of moderate-intensity or vigorous-intensity exercise each week. This could be brisk walking, biking, or water aerobics. ? Stretching and doing strength exercises, such as yoga or weightlifting, at least 2 times a week.  Take medicines as told by your health care provider.  Do not use any products that contain nicotine or tobacco, such as cigarettes and e-cigarettes. If you need help quitting, ask your health care provider.  Work with a Social worker or diabetes educator to identify strategies to manage stress and any emotional and social challenges. Questions to ask a health care provider  Do I need to meet with a diabetes educator?  Do I need to meet with a dietitian?  What  number can I call if I have questions?  When are the best times to check my blood glucose? Where to find more information:  American Diabetes Association: diabetes.org  Academy of Nutrition and Dietetics: www.eatright.[employer] of Diabetes and Digestive and Kidney Diseases (NIH): [employer] Summary  A healthy meal plan will help you control your blood glucose and maintain a healthy lifestyle.  Working with a diet and nutrition specialist (dietitian) can help you make a meal plan that is best for you.  Keep in mind that carbohydrates (carbs) and alcohol have immediate effects on your blood glucose levels. It is important to count carbs and to use alcohol carefully. This information is not intended to replace advice given to you by your health care provider. Make sure you discuss any questions you have with your health care provider. Document Released: 03/27/2005 Document Revised: 06/12/2017 Document Reviewed: 08/04/2016 Elsevier Patient Education  2020 [employer].

## 2019-03-09 ENCOUNTER — Other Ambulatory Visit: Payer: Self-pay | Admitting: Internal Medicine

## 2019-03-09 NOTE — Telephone Encounter (Signed)
Routing to CMA 

## 2019-03-09 NOTE — Telephone Encounter (Signed)
Pt calling states that she came in yesterday requesting this medication at her appointment - states that she is completely out and wants to know why.   Pt wants to speak directly with Dr. Sharlet Salina to know why this wasn't called in.  Pt can be reached at [phone].

## 2019-03-11 NOTE — Assessment & Plan Note (Signed)
Pancreatic lesion does require follow up imaging and given covid she would like to wait unless there is a significant change in HgA1c as this could indicate change in pancreatic lesion.

## 2019-03-11 NOTE — Assessment & Plan Note (Signed)
Checking lipid panel and adjust simvastatin if needed for goal <100.

## 2019-03-11 NOTE — Assessment & Plan Note (Signed)
Checking HgA1c, foot exam done. Checking lipid panel and adjust regimen of glipizide and simvastatin. Not on ACE-I or ARB currently and after labs may need this.

## 2019-03-14 ENCOUNTER — Other Ambulatory Visit: Payer: Self-pay | Admitting: Internal Medicine

## 2019-04-21 ENCOUNTER — Other Ambulatory Visit: Payer: Self-pay | Admitting: Internal Medicine

## 2019-06-22 ENCOUNTER — Other Ambulatory Visit: Payer: Self-pay | Admitting: Internal Medicine

## 2019-06-22 MED ORDER — SIMVASTATIN 20 MG PO TABS: ORAL_TABLET | ORAL | 0 refills | Status: DC

## 2019-06-22 NOTE — Telephone Encounter (Signed)
Copied from [place] [id number]. Topic: Quick Communication - Rx Refill/Question >> Jun 22, 2019  9:50 AM Rainey Pines A wrote: Medication: simvastatin (ZOCOR) 20 MG tablet  Has the patient contacted their pharmacy? yes (Agent: If no, request that the patient contact the pharmacy for the refill.) (Agent: If yes, when and what did the pharmacy advise?)Contact PCP  Preferred Pharmacy (with phone number or street name): [facility] [address], [place] [phone] (Phone) [phone] (Fax)    Agent: Please be advised that RX refills may take up to 3 business days. We ask that you follow-up with your pharmacy.

## 2019-09-06 ENCOUNTER — Other Ambulatory Visit: Payer: Self-pay | Admitting: Internal Medicine

## 2019-09-28 ENCOUNTER — Other Ambulatory Visit: Payer: Self-pay | Admitting: Internal Medicine

## 2019-10-19 ENCOUNTER — Other Ambulatory Visit: Payer: Self-pay | Admitting: Internal Medicine

## 2019-12-01 ENCOUNTER — Other Ambulatory Visit: Payer: Self-pay | Admitting: Internal Medicine

## 2020-01-17 ENCOUNTER — Other Ambulatory Visit: Payer: Self-pay | Admitting: Internal Medicine

## 2020-01-19 ENCOUNTER — Other Ambulatory Visit: Payer: Self-pay | Admitting: Internal Medicine

## 2020-03-05 ENCOUNTER — Other Ambulatory Visit: Payer: Self-pay | Admitting: Internal Medicine

## 2020-03-06 ENCOUNTER — Telehealth: Payer: Self-pay

## 2020-03-06 NOTE — Telephone Encounter (Signed)
1.Medication Requested:amLODipine (NORVASC) 5 MG tablet  2. Pharmacy (Name, Street, Fox):[facility] [address] [place], [place] - [address]  3. On Med List: Yes   4. Last Visit with PCP: 8.25.20   5. Next visit date with PCP: n/a    Agent: Please be advised that RX refills may take up to 3 business days. We ask that you follow-up with your pharmacy.

## 2020-03-07 ENCOUNTER — Other Ambulatory Visit: Payer: Self-pay | Admitting: Internal Medicine

## 2020-03-08 ENCOUNTER — Telehealth: Payer: Self-pay

## 2020-03-08 NOTE — Telephone Encounter (Deleted)
error 

## 2020-03-12 ENCOUNTER — Other Ambulatory Visit: Payer: Self-pay

## 2020-03-12 ENCOUNTER — Ambulatory Visit (INDEPENDENT_AMBULATORY_CARE_PROVIDER_SITE_OTHER): Payer: Medicare Other | Admitting: Family

## 2020-03-12 VITALS — BP 146/78 | HR 102 | Temp 98.6°F | Ht 62.0 in | Wt 169.8 lb

## 2020-03-12 DIAGNOSIS — E559 Vitamin D deficiency, unspecified: Secondary | ICD-10-CM | POA: Diagnosis not present

## 2020-03-12 DIAGNOSIS — E118 Type 2 diabetes mellitus with unspecified complications: Secondary | ICD-10-CM

## 2020-03-12 DIAGNOSIS — E1169 Type 2 diabetes mellitus with other specified complication: Secondary | ICD-10-CM | POA: Diagnosis not present

## 2020-03-12 DIAGNOSIS — I10 Essential (primary) hypertension: Secondary | ICD-10-CM | POA: Diagnosis not present

## 2020-03-12 DIAGNOSIS — E785 Hyperlipidemia, unspecified: Secondary | ICD-10-CM

## 2020-03-12 DIAGNOSIS — M2550 Pain in unspecified joint: Secondary | ICD-10-CM

## 2020-03-12 LAB — VITAMIN D 25 HYDROXY (VIT D DEFICIENCY, FRACTURES): Vit D, 25-Hydroxy: 60 ng/mL (ref 30–100)

## 2020-03-12 MED ORDER — GLIPIZIDE ER 5 MG PO TB24: ORAL_TABLET | ORAL | 0 refills | Status: DC

## 2020-03-12 MED ORDER — FLUTICASONE PROPIONATE 50 MCG/ACT NA SUSP: 2.0000 | Freq: Every day | NASAL | 6 refills | Status: DC

## 2020-03-12 MED ORDER — SIMVASTATIN 20 MG PO TABS: ORAL_TABLET | ORAL | 0 refills | Status: DC

## 2020-03-12 MED ORDER — AMLODIPINE BESYLATE 5 MG PO TABS: 5.0000 mg | ORAL_TABLET | Freq: Every day | ORAL | 0 refills | Status: DC

## 2020-03-12 MED ORDER — MELOXICAM 15 MG PO TABS: 15.0000 mg | ORAL_TABLET | Freq: Every day | ORAL | 1 refills | Status: DC

## 2020-03-12 NOTE — Progress Notes (Signed)
Katrina Simmons is a 79 y.o. female with the following history as recorded in EpicCare:  Patient Active Problem List   Diagnosis Date Noted  . Hearing loss due to cerumen impaction, bilateral 02/18/2018  . Routine general medical examination at a health care facility 04/28/2017  . Otitis externa 08/21/2016  . Muscle cramps 04/25/2016  . Allergic rhinitis 02/01/2016  . Hemorrhoids 11/02/2015  . Knee pain 08/10/2015  . Type 2 diabetes with complication ([place]) 98/26/4158  . Hyperlipidemia associated with type 2 diabetes mellitus ([place]) 08/05/2015  . Imaging abnormality 08/05/2015    Current Outpatient Medications  Medication Sig Dispense Refill  . amLODipine (NORVASC) 5 MG tablet Take 1 tablet (5 mg total) by mouth daily. Annual appt due in August must see provider for future refills 90 tablet 0  . aspirin 81 MG tablet Take 81 mg by mouth daily.    . Cholecalciferol (D-3-5) 5000 units capsule Take 5,000 Units by mouth daily.    . diclofenac sodium (VOLTAREN) 1 % GEL Apply 2 g topically 3 (three) times daily as needed. 100 g 2  . fluticasone (FLONASE) 50 MCG/ACT nasal spray Place 2 sprays into both nostrils daily. 16 g 6  . glipiZIDE (GLUCOTROL XL) 5 MG 24 hr tablet Take 1 tablet by mouth once daily with breakfast 90 tablet 0  . hydrocortisone (PROCTOSOL HC) 2.5 % rectal cream Place 1 application rectally 2 (two) times daily. 30 g 11  . meloxicam (MOBIC) 15 MG tablet Take 1 tablet (15 mg total) by mouth daily. 30 tablet 1  . Menthol, Topical Analgesic, 2.5 % GEL Apply topically.    . simvastatin (ZOCOR) 20 MG tablet TAKE 1 TABLET BY MOUTH ONCE DAILY AT 6 PM 90 tablet 0   No current facility-administered medications for this visit.    Allergies: Patient has no known allergies.  Past Medical History:  Diagnosis Date  . Allergy   . Arthritis   . Diabetes mellitus without complication ([place])   . Diverticulosis   . GERD (gastroesophageal reflux disease)   . Hiatal hernia   . History of  colon polyps   . Hypercholesteremia   . Hyperlipidemia   . Hypertension   . Hyperthyroidism   . Internal hemorrhoids   . Lesion of right lobe of liver   . Pancreatic cyst   . Urinary incontinence     Past Surgical History:  Procedure Laterality Date  . ABDOMINAL HYSTERECTOMY      Family History  Problem Relation Age of Onset  . Stroke Mother   . Hypertension Mother   . Diabetes Mother   . Cancer Father        lung and prostate    Social History   Tobacco Use  . Smoking status: Former Research scientist (life sciences)  . Smokeless tobacco: Never Used  Substance Use Topics  . Alcohol use: No    Alcohol/week: 0.0 standard drinks    Subjective:  Accompanied by daughter; needs refills on medications for chronic conditions; planning to see her PCP when she returns from maternity leave later this year;   Objective:  Vitals:   03/12/20 1308  BP: (!) 146/78  Pulse: (!) 102  Temp: 98.6 F (37 C)  SpO2: 98%  Weight: 169 lb 12.8 oz (77 kg)  Height: '5\' 2"'  (1.575 m)    General: Well developed, well nourished, in no acute distress  Skin : Warm and dry.  Head: Normocephalic and atraumatic  Lungs: Respirations unlabored; clear to auscultation bilaterally without wheeze, rales, rhonchi  CVS exam: normal rate and regular rhythm.  Abdomen: Soft; nontender; nondistended; normoactive bowel sounds; no masses or hepatosplenomegaly  Musculoskeletal: No deformities; no active joint inflammation  Extremities: No edema, cyanosis, clubbing  Vessels: Symmetric bilaterally  Neurologic: Alert and oriented; speech intact; face symmetrical; moves all extremities well; CNII-XII intact without focal deficit  Assessment:  1. Type 2 diabetes with complication (HCC)   2. Vitamin D deficiency   3. Hyperlipidemia associated with type 2 diabetes mellitus ([place])   4. Essential hypertension   5. Arthralgia, unspecified joint     Plan:  1. Check CMP, Hgba1c; refill updated; 2. Check Vitamin D level today; 3. Check lipid  panel; refill updated; 4. Uncontrolled- patient has been off medication x 5 days; refill updated; needs to see her PCP in 3-4 months; 5. Trial of Mobic 15 mg daily;  Discussed updating imaging for pancreatic lesion that was mentioned in last OV and patient and daughter note this does not need to be done; will let her discuss with PCP at next OV;   This visit occurred during the SARS-CoV-2 public health emergency.  Safety protocols were in place, including screening questions prior to the visit, additional usage of staff PPE, and extensive cleaning of exam room while observing appropriate contact time as indicated for disinfecting solutions.     No follow-ups on file.  Orders Placed This Encounter  Procedures  . CBC with Differential/Platelet    Standing Status:   Future    Standing Expiration Date:   03/12/2021  . Comp Met (CMET)    Standing Status:   Future    Standing Expiration Date:   03/12/2021  . Lipid panel    Standing Status:   Future    Standing Expiration Date:   03/12/2021  . Hemoglobin A1c    Standing Status:   Future    Standing Expiration Date:   03/12/2021  . Vitamin D (25 hydroxy)    Standing Status:   Future    Standing Expiration Date:   03/12/2021    Requested Prescriptions   Signed Prescriptions Disp Refills  . amLODipine (NORVASC) 5 MG tablet 90 tablet 0    Sig: Take 1 tablet (5 mg total) by mouth daily. Annual appt due in August must see provider for future refills  . fluticasone (FLONASE) 50 MCG/ACT nasal spray 16 g 6    Sig: Place 2 sprays into both nostrils daily.  Marland Kitchen glipiZIDE (GLUCOTROL XL) 5 MG 24 hr tablet 90 tablet 0    Sig: Take 1 tablet by mouth once daily with breakfast  . meloxicam (MOBIC) 15 MG tablet 30 tablet 1    Sig: Take 1 tablet (15 mg total) by mouth daily.  . simvastatin (ZOCOR) 20 MG tablet 90 tablet 0    Sig: TAKE 1 TABLET BY MOUTH ONCE DAILY AT 6 PM

## 2020-03-13 LAB — CBC WITH DIFFERENTIAL/PLATELET
Absolute Monocytes: 566 cells/uL (ref 200–950)
Basophils Absolute: 20 cells/uL (ref 0–200)
Basophils Relative: 0.3 %
Eosinophils Absolute: 20 cells/uL (ref 15–500)
Eosinophils Relative: 0.3 %
HCT: 38.3 % (ref 35.0–45.0)
Hemoglobin: 12.9 g/dL (ref 11.7–15.5)
Lymphs Abs: 1034 cells/uL (ref 850–3900)
MCH: 29.7 pg (ref 27.0–33.0)
MCHC: 33.7 g/dL (ref 32.0–36.0)
MCV: 88 fL (ref 80.0–100.0)
MPV: 11.6 fL (ref 7.5–12.5)
Monocytes Relative: 8.7 %
Neutro Abs: 4862 cells/uL (ref 1500–7800)
Neutrophils Relative %: 74.8 %
Platelets: 220 10*3/uL (ref 140–400)
RBC: 4.35 10*6/uL (ref 3.80–5.10)
RDW: 13.3 % (ref 11.0–15.0)
Total Lymphocyte: 15.9 %
WBC: 6.5 10*3/uL (ref 3.8–10.8)

## 2020-03-13 LAB — HEMOGLOBIN A1C
Hgb A1c MFr Bld: 6.9 % of total Hgb — ABNORMAL HIGH (ref <5.7)
Mean Plasma Glucose: 151 (calc)
eAG (mmol/L): 8.4 (calc)

## 2020-03-13 LAB — COMPREHENSIVE METABOLIC PANEL
AG Ratio: 1.3 (calc) (ref 1.0–2.5)
ALT: 10 U/L (ref 6–29)
AST: 15 U/L (ref 10–35)
Albumin: 4.2 g/dL (ref 3.6–5.1)
Alkaline phosphatase (APISO): 88 U/L (ref 37–153)
BUN: 18 mg/dL (ref 7–25)
CO2: 28 mmol/L (ref 20–32)
Calcium: 9.3 mg/dL (ref 8.6–10.4)
Chloride: 102 mmol/L (ref 98–110)
Creat: 0.77 mg/dL (ref 0.60–0.88)
Globulin: 3.3 g/dL (calc) (ref 1.9–3.7)
Glucose, Bld: 155 mg/dL — ABNORMAL HIGH (ref 65–99)
Potassium: 3.6 mmol/L (ref 3.5–5.3)
Sodium: 140 mmol/L (ref 135–146)
Total Bilirubin: 0.6 mg/dL (ref 0.2–1.2)
Total Protein: 7.5 g/dL (ref 6.1–8.1)

## 2020-03-13 LAB — LIPID PANEL
Cholesterol: 172 mg/dL (ref <200)
HDL: 61 mg/dL (ref >=50)
LDL Cholesterol (Calc): 86 mg/dL (calc)
Non-HDL Cholesterol (Calc): 111 mg/dL (calc) (ref <130)
Total CHOL/HDL Ratio: 2.8 (calc) (ref <5.0)
Triglycerides: 150 mg/dL — ABNORMAL HIGH (ref <150)

## 2020-06-11 ENCOUNTER — Other Ambulatory Visit: Payer: Self-pay

## 2020-06-11 ENCOUNTER — Encounter: Payer: Self-pay | Admitting: Internal Medicine

## 2020-06-11 ENCOUNTER — Ambulatory Visit (INDEPENDENT_AMBULATORY_CARE_PROVIDER_SITE_OTHER): Payer: Medicare Other | Admitting: Internal Medicine

## 2020-06-11 VITALS — BP 150/90 | HR 99 | Temp 98.8°F | Ht 62.0 in | Wt 168.4 lb

## 2020-06-11 DIAGNOSIS — E1169 Type 2 diabetes mellitus with other specified complication: Secondary | ICD-10-CM

## 2020-06-11 DIAGNOSIS — Z0000 Encounter for general adult medical examination without abnormal findings: Secondary | ICD-10-CM | POA: Diagnosis not present

## 2020-06-11 DIAGNOSIS — E118 Type 2 diabetes mellitus with unspecified complications: Secondary | ICD-10-CM | POA: Diagnosis not present

## 2020-06-11 DIAGNOSIS — E785 Hyperlipidemia, unspecified: Secondary | ICD-10-CM

## 2020-06-11 DIAGNOSIS — N393 Stress incontinence (female) (male): Secondary | ICD-10-CM | POA: Diagnosis not present

## 2020-06-11 MED ORDER — AMLODIPINE BESYLATE 5 MG PO TABS
5.0000 mg | ORAL_TABLET | Freq: Every day | ORAL | 3 refills | Status: DC
Start: 2020-06-11 — End: 2021-06-05

## 2020-06-11 MED ORDER — MELOXICAM 15 MG PO TABS
15.0000 mg | ORAL_TABLET | Freq: Every day | ORAL | 3 refills | Status: DC
Start: 2020-06-11 — End: 2020-11-12

## 2020-06-11 MED ORDER — OXYBUTYNIN CHLORIDE 5 MG PO TABS: 5.0000 mg | ORAL_TABLET | Freq: Two times a day (BID) | ORAL | 1 refills | Status: DC | PRN

## 2020-06-11 MED ORDER — MONTELUKAST SODIUM 10 MG PO TABS: 10.0000 mg | ORAL_TABLET | Freq: Every day | ORAL | 0 refills | Status: DC

## 2020-06-11 MED ORDER — GLIPIZIDE ER 5 MG PO TB24
ORAL_TABLET | ORAL | 3 refills | Status: DC
Start: 2020-06-11 — End: 2021-07-05

## 2020-06-11 MED ORDER — SIMVASTATIN 20 MG PO TABS
ORAL_TABLET | ORAL | 3 refills | Status: DC
Start: 2020-06-11 — End: 2021-09-12

## 2020-06-11 NOTE — Progress Notes (Signed)
   Subjective:   Patient ID: Katrina Simmons, female    DOB: August 21, 1940, 79 y.o.   MRN: [id number]  HPI The patient is an 79 YO female coming in for physical.  PMH, FMH, social history reviewed and updated  Review of Systems  Constitutional: Negative.   HENT: Negative.   Eyes: Negative.   Respiratory: Negative for cough, chest tightness and shortness of breath.   Cardiovascular: Negative for chest pain, palpitations and leg swelling.  Gastrointestinal: Negative for abdominal distention, abdominal pain, constipation, diarrhea, nausea and vomiting.  Genitourinary: Positive for frequency.  Musculoskeletal: Negative.   Skin: Negative.   Neurological: Negative.   Psychiatric/Behavioral: Negative.     Objective:  Physical Exam Constitutional:      Appearance: She is well-developed.  HENT:     Head: Normocephalic and atraumatic.  Cardiovascular:     Rate and Rhythm: Normal rate and regular rhythm.  Pulmonary:     Effort: Pulmonary effort is normal. No respiratory distress.     Breath sounds: Normal breath sounds. No wheezing or rales.  Abdominal:     General: Bowel sounds are normal. There is no distension.     Palpations: Abdomen is soft.     Tenderness: There is no abdominal tenderness. There is no rebound.  Musculoskeletal:     Cervical back: Normal range of motion.  Skin:    General: Skin is warm and dry.  Neurological:     Mental Status: She is alert and oriented to person, place, and time.     Coordination: Coordination normal.     Vitals:   06/11/20 1600  BP: (!) 150/90  Pulse: 99  Temp: 98.8 F (37.1 C)  TempSrc: Oral  SpO2: 97%  Weight: 168 lb 6.4 oz (76.4 kg)  Height: 5\' 2"  (1.575 m)    This visit occurred during the SARS-CoV-2 public health emergency.  Safety protocols were in place, including screening questions prior to the visit, additional usage of staff PPE, and extensive cleaning of exam room while observing appropriate contact time as indicated  for disinfecting solutions.   Assessment & Plan:

## 2020-06-11 NOTE — Patient Instructions (Addendum)
We have sent in singulair to take 1 pill daily for the congestion. If you are not getting better in another 1-2 days think about taking a test for covid-19. You can get this at most pharmacies with an appointment.   Health Maintenance, Female Adopting a healthy lifestyle and getting preventive care are important in promoting health and wellness. Ask your health care provider about:  The right schedule for you to have regular tests and exams.  Things you can do on your own to prevent diseases and keep yourself healthy. What should I know about diet, weight, and exercise? Eat a healthy diet   Eat a diet that includes plenty of vegetables, fruits, low-fat dairy products, and lean protein.  Do not eat a lot of foods that are high in solid fats, added sugars, or sodium. Maintain a healthy weight Body mass index (BMI) is used to identify weight problems. It estimates body fat based on height and weight. Your health care provider can help determine your BMI and help you achieve or maintain a healthy weight. Get regular exercise Get regular exercise. This is one of the most important things you can do for your health. Most adults should:  Exercise for at least 150 minutes each week. The exercise should increase your heart rate and make you sweat (moderate-intensity exercise).  Do strengthening exercises at least twice a week. This is in addition to the moderate-intensity exercise.  Spend less time sitting. Even light physical activity can be beneficial. Watch cholesterol and blood lipids Have your blood tested for lipids and cholesterol at 81 years of age, then have this test every 5 years. Have your cholesterol levels checked more often if:  Your lipid or cholesterol levels are high.  You are older than 81 years of age.  You are at high risk for heart disease. What should I know about cancer screening? Depending on your health history and family history, you may need to have cancer  screening at various ages. This may include screening for:  Breast cancer.  Cervical cancer.  Colorectal cancer.  Skin cancer.  Lung cancer. What should I know about heart disease, diabetes, and high blood pressure? Blood pressure and heart disease  High blood pressure causes heart disease and increases the risk of stroke. This is more likely to develop in people who have high blood pressure readings, are of African descent, or are overweight.  Have your blood pressure checked: ? Every 3-5 years if you are 13-35 years of age. ? Every year if you are 39 years old or older. Diabetes Have regular diabetes screenings. This checks your fasting blood sugar level. Have the screening done:  Once every three years after age 36 if you are at a normal weight and have a low risk for diabetes.  More often and at a younger age if you are overweight or have a high risk for diabetes. What should I know about preventing infection? Hepatitis B If you have a higher risk for hepatitis B, you should be screened for this virus. Talk with your health care provider to find out if you are at risk for hepatitis B infection. Hepatitis C Testing is recommended for:  Everyone born from [birth year] through 1967.  Anyone with known risk factors for hepatitis C. Sexually transmitted infections (STIs)  Get screened for STIs, including gonorrhea and chlamydia, if: ? You are sexually active and are younger than 81 years of age. ? You are older than 81 years of age and your  health care provider tells you that you are at risk for this type of infection. ? Your sexual activity has changed since you were last screened, and you are at increased risk for chlamydia or gonorrhea. Ask your health care provider if you are at risk.  Ask your health care provider about whether you are at high risk for HIV. Your health care provider may recommend a prescription medicine to help prevent HIV infection. If you choose to take  medicine to prevent HIV, you should first get tested for HIV. You should then be tested every 3 months for as long as you are taking the medicine. Pregnancy  If you are about to stop having your period (premenopausal) and you may become pregnant, seek counseling before you get pregnant.  Take 400 to 800 micrograms (mcg) of folic acid every day if you become pregnant.  Ask for birth control (contraception) if you want to prevent pregnancy. Osteoporosis and menopause Osteoporosis is a disease in which the bones lose minerals and strength with aging. This can result in bone fractures. If you are 21 years old or older, or if you are at risk for osteoporosis and fractures, ask your health care provider if you should:  Be screened for bone loss.  Take a calcium or vitamin D supplement to lower your risk of fractures.  Be given hormone replacement therapy (HRT) to treat symptoms of menopause. Follow these instructions at home: Lifestyle  Do not use any products that contain nicotine or tobacco, such as cigarettes, e-cigarettes, and chewing tobacco. If you need help quitting, ask your health care provider.  Do not use street drugs.  Do not share needles.  Ask your health care provider for help if you need support or information about quitting drugs. Alcohol use  Do not drink alcohol if: ? Your health care provider tells you not to drink. ? You are pregnant, may be pregnant, or are planning to become pregnant.  If you drink alcohol: ? Limit how much you use to 0-1 drink a day. ? Limit intake if you are breastfeeding.  Be aware of how much alcohol is in your drink. In the [place] one drink equals one 12 oz bottle of beer (355 mL), one 5 oz glass of wine (148 mL), or one 1 oz glass of hard liquor (44 mL). General instructions  Schedule regular health, dental, and eye exams.  Stay current with your vaccines.  Tell your health care provider if: ? You often feel depressed. ? You have  ever been abused or do not feel safe at home. Summary  Adopting a healthy lifestyle and getting preventive care are important in promoting health and wellness.  Follow your health care provider's instructions about healthy diet, exercising, and getting tested or screened for diseases.  Follow your health care provider's instructions on monitoring your cholesterol and blood pressure. This information is not intended to replace advice given to you by your health care provider. Make sure you discuss any questions you have with your health care provider. Document Revised: 06/23/2018 Document Reviewed: 06/23/2018 Elsevier Patient Education  2020 [employer].

## 2020-06-13 DIAGNOSIS — N393 Stress incontinence (female) (male): Secondary | ICD-10-CM | POA: Insufficient documentation

## 2020-06-13 NOTE — Assessment & Plan Note (Signed)
Reviewed recent labs and at goal. Continue glipizide 5 mg daily and she is on statin.

## 2020-06-13 NOTE — Assessment & Plan Note (Signed)
Flu shot declines. Covid-19 counseled to get. Pneumonia declines. Shingrix declines. Tetanus declines. Colonoscopy aged out. Mammogram aged out, pap smear aged out and dexa declines. Counseled about sun safety and mole surveillance. Counseled about the dangers of distracted driving. Given 10 year screening recommendations.

## 2020-06-13 NOTE — Assessment & Plan Note (Signed)
Refill oxybutynin which was effective as needed for symptoms.

## 2020-06-13 NOTE — Assessment & Plan Note (Signed)
Recent lipid panel at goal on simvastatin.

## 2020-07-04 ENCOUNTER — Telehealth: Payer: Self-pay | Admitting: Internal Medicine

## 2020-07-04 NOTE — Progress Notes (Signed)
  Chronic Care Management   Outreach Note  07/04/2020 Name: Katrina Simmons MRN: [id number] DOB: [birth year]  Referred by: Myrlene Broker, MD Reason for referral : No chief complaint on file.   An unsuccessful telephone outreach was attempted today. The patient was referred to the pharmacist for assistance with care management and care coordination.   Follow Up Plan:   Carley Perdue [employer] Scheduler

## 2020-08-03 ENCOUNTER — Telehealth: Payer: Self-pay | Admitting: Internal Medicine

## 2020-08-03 NOTE — Progress Notes (Signed)
  Chronic Care Management   Note  08/03/2020 Name: Katrina Simmons MRN: [id number] DOB: [birth year]  Katrina Simmons is a 80 y.o. year old female who is a primary care patient of Myrlene Broker, MD. I reached out to [name] by phone today in response to a referral sent by Ms. Arlana Sleeper's PCP, Myrlene Broker, MD.   Ms. Doepke was given information about Chronic Care Management services today including:  1. CCM service includes personalized support from designated clinical staff supervised by her physician, including individualized plan of care and coordination with other care providers 2. 24/7 contact phone numbers for assistance for urgent and routine care needs. 3. Service will only be billed when office clinical staff spend 20 minutes or more in a month to coordinate care. 4. Only one practitioner may furnish and bill the service in a calendar month. 5. The patient may stop CCM services at any time (effective at the end of the month) by phone call to the office staff.   Patient wishes to consider information provided and/or speak with a member of the care team before deciding about enrollment in care management services.   Follow up plan:   Carley Perdue [employer] Scheduler

## 2020-08-10 ENCOUNTER — Telehealth: Payer: Self-pay | Admitting: Internal Medicine

## 2020-08-10 NOTE — Progress Notes (Signed)
  Chronic Care Management   Outreach Note  08/10/2020 Name: Katrina Simmons MRN: [id number] DOB: 1941-04-24  Referred by: Myrlene Broker, MD Reason for referral : No chief complaint on file.   Third unsuccessful telephone outreach was attempted today. The patient was referred to the pharmacist for assistance with care management and care coordination.   Follow Up Plan:   Carley Perdue [employer] Scheduler

## 2020-11-09 ENCOUNTER — Other Ambulatory Visit: Payer: Self-pay

## 2020-11-12 ENCOUNTER — Ambulatory Visit (INDEPENDENT_AMBULATORY_CARE_PROVIDER_SITE_OTHER): Payer: Medicare Other | Admitting: Internal Medicine

## 2020-11-12 ENCOUNTER — Other Ambulatory Visit: Payer: Self-pay

## 2020-11-12 ENCOUNTER — Encounter: Payer: Self-pay | Admitting: Internal Medicine

## 2020-11-12 VITALS — BP 136/76 | HR 104 | Temp 99.4°F | Resp 18 | Ht 62.0 in | Wt 165.6 lb

## 2020-11-12 DIAGNOSIS — E2839 Other primary ovarian failure: Secondary | ICD-10-CM

## 2020-11-12 DIAGNOSIS — G44319 Acute post-traumatic headache, not intractable: Secondary | ICD-10-CM | POA: Diagnosis not present

## 2020-11-12 MED ORDER — GABAPENTIN 100 MG PO CAPS: 100.0000 mg | ORAL_CAPSULE | Freq: Two times a day (BID) | ORAL | 3 refills | Status: DC

## 2020-11-12 MED ORDER — CELECOXIB 100 MG PO CAPS: 100.0000 mg | ORAL_CAPSULE | Freq: Two times a day (BID) | ORAL | 1 refills | Status: DC

## 2020-11-12 NOTE — Assessment & Plan Note (Signed)
Started with trauma about 2 months ago. Change meloxicam to celebrex and add gabapentin 100 mg BID prn. Checking CT head to rule out subacute bleeding in brain or fracture.

## 2020-11-12 NOTE — Progress Notes (Signed)
   Subjective:   Patient ID: Katrina Simmons, female    DOB: 1941-04-24, 80 y.o.   MRN: [id number]  HPI The patient is an 80 YO female coming in for concerns about headache (started in early March when getting car serviced she hit head on a door/gate while walking out, has been having this since then, denies vision changes or nausea or confusion, pain lasts several hours and is several times per week, is on the top and sides of the head, meloxicam and tylenol has not helped).   Review of Systems  Constitutional: Negative.   HENT: Negative.   Eyes: Negative.   Respiratory: Negative for cough, chest tightness and shortness of breath.   Cardiovascular: Negative for chest pain, palpitations and leg swelling.  Gastrointestinal: Negative for abdominal distention, abdominal pain, constipation, diarrhea, nausea and vomiting.  Musculoskeletal: Positive for arthralgias and myalgias.  Skin: Negative.   Neurological: Positive for headaches.  Psychiatric/Behavioral: Negative.     Objective:  Physical Exam Constitutional:      Appearance: She is well-developed.  HENT:     Head: Normocephalic and atraumatic.     Ears:     Comments: Some tenderness at the top of the head, no obvious deformity or fracture detected Cardiovascular:     Rate and Rhythm: Normal rate and regular rhythm.  Pulmonary:     Effort: Pulmonary effort is normal. No respiratory distress.     Breath sounds: Normal breath sounds. No wheezing or rales.  Abdominal:     General: Bowel sounds are normal. There is no distension.     Palpations: Abdomen is soft.     Tenderness: There is no abdominal tenderness. There is no rebound.  Musculoskeletal:        General: Tenderness present.     Cervical back: Normal range of motion.  Skin:    General: Skin is warm and dry.  Neurological:     Mental Status: She is alert and oriented to person, place, and time.     Coordination: Coordination normal.     Vitals:   11/12/20 1518   BP: 136/76  Pulse: (!) 104  Resp: 18  Temp: 99.4 F (37.4 C)  TempSrc: Oral  SpO2: 99%  Weight: 165 lb 9.6 oz (75.1 kg)  Height: 5\' 2"  (1.575 m)    This visit occurred during the SARS-CoV-2 public health emergency.  Safety protocols were in place, including screening questions prior to the visit, additional usage of staff PPE, and extensive cleaning of exam room while observing appropriate contact time as indicated for disinfecting solutions.   Assessment & Plan:

## 2020-11-12 NOTE — Patient Instructions (Signed)
We will try celebrex to take 1 pill twice a day instead of meloxicam (mobic).   We have sent in gabapentin to take 1 pill up to twice a day for pain.  We are going to check a scan of the brain to look for a cause of the pain.

## 2020-11-17 ENCOUNTER — Other Ambulatory Visit: Payer: Self-pay

## 2020-11-17 ENCOUNTER — Ambulatory Visit
Admission: RE | Admit: 2020-11-17 | Discharge: 2020-11-17 | Disposition: A | Discharge status: Home / Self Care | Payer: Medicare Other | Source: Ambulatory Visit | Attending: Internal Medicine | Admitting: Internal Medicine

## 2020-11-17 DIAGNOSIS — G44319 Acute post-traumatic headache, not intractable: Secondary | ICD-10-CM

## 2020-11-21 ENCOUNTER — Telehealth: Payer: Self-pay | Admitting: Internal Medicine

## 2020-11-21 NOTE — Telephone Encounter (Signed)
See below , thanks

## 2020-11-21 NOTE — Telephone Encounter (Signed)
Patient calling to get the results of her CT scan

## 2021-01-25 ENCOUNTER — Ambulatory Visit (INDEPENDENT_AMBULATORY_CARE_PROVIDER_SITE_OTHER): Payer: Medicare Other

## 2021-01-25 DIAGNOSIS — Z0000 Encounter for general adult medical examination without abnormal findings: Secondary | ICD-10-CM

## 2021-01-25 NOTE — Progress Notes (Addendum)
I connected with Katrina Simmons today by telephone and verified that I am speaking with the correct person using two identifiers. Location patient: home Location provider: work Persons participating in the virtual visit: patient, provider.   I discussed the limitations, risks, security and privacy concerns of performing an evaluation and management service by telephone and the availability of in person appointments. I also discussed with the patient that there may be a patient responsible charge related to this service. The patient expressed understanding and verbally consented to this telephonic visit.    Interactive audio and video telecommunications were attempted between this provider and patient, however failed, due to patient having technical difficulties OR patient did not have access to video capability.  We continued and completed visit with audio only.  Some vital signs may be absent or patient reported.   Time Spent with patient on telephone encounter: 30 minutes  Subjective:   Katrina Simmons is a 80 y.o. female who presents for Medicare Annual (Subsequent) preventive examination.  Review of Systems     Cardiac Risk Factors include: advanced age (>78men, >68 women);diabetes mellitus;hypertension;dyslipidemia;family history of premature cardiovascular disease     Objective:    There were no vitals filed for this visit. There is no height or weight on file to calculate BMI.  Advanced Directives 01/25/2021  Does Patient Have a Medical Advance Directive? No  Would patient like information on creating a medical advance directive? No - Patient declined    Current Medications (verified) Outpatient Encounter Medications as of 01/25/2021  Medication Sig   amLODipine (NORVASC) 5 MG tablet Take 1 tablet (5 mg total) by mouth daily. Annual appt due in August must see provider for future refills   aspirin 81 MG tablet Take 81 mg by mouth daily.   celecoxib (CELEBREX) 100 MG  capsule Take 1 capsule (100 mg total) by mouth 2 (two) times daily.   Cholecalciferol 125 MCG (5000 UT) capsule Take 5,000 Units by mouth daily.   diclofenac sodium (VOLTAREN) 1 % GEL Apply 2 g topically 3 (three) times daily as needed.   fluticasone (FLONASE) 50 MCG/ACT nasal spray Place 2 sprays into both nostrils daily.   gabapentin (NEURONTIN) 100 MG capsule Take 1 capsule (100 mg total) by mouth 2 (two) times daily.   glipiZIDE (GLUCOTROL XL) 5 MG 24 hr tablet Take 1 tablet by mouth once daily with breakfast   hydrocortisone (PROCTOSOL HC) 2.5 % rectal cream Place 1 application rectally 2 (two) times daily.   Menthol, Topical Analgesic, 2.5 % GEL Apply topically.   montelukast (SINGULAIR) 10 MG tablet Take 1 tablet (10 mg total) by mouth at bedtime.   oxybutynin (DITROPAN) 5 MG tablet Take 1 tablet (5 mg total) by mouth 2 (two) times daily as needed for bladder spasms.   simvastatin (ZOCOR) 20 MG tablet TAKE 1 TABLET BY MOUTH ONCE DAILY AT 6 PM   No facility-administered encounter medications on file as of 01/25/2021.    Allergies (verified) Patient has no known allergies.   History: Past Medical History:  Diagnosis Date   Allergy    Arthritis    Diabetes mellitus without complication (HCC)    Diverticulosis    GERD (gastroesophageal reflux disease)    Hiatal hernia    History of colon polyps    Hypercholesteremia    Hyperlipidemia    Hypertension    Hyperthyroidism    Internal hemorrhoids    Lesion of right lobe of liver    Pancreatic cyst  Urinary incontinence    Past Surgical History:  Procedure Laterality Date   ABDOMINAL HYSTERECTOMY     Family History  Problem Relation Age of Onset   Stroke Mother    Hypertension Mother    Diabetes Mother    Cancer Father        lung and prostate   Social History   Socioeconomic History   Marital status: Divorced    Spouse name: Not on file   Number of children: Not on file   Years of education: Not on file    Highest education level: Not on file  Occupational History   Not on file  Tobacco Use   Smoking status: Former   Smokeless tobacco: Never  Substance and Sexual Activity   Alcohol use: No    Alcohol/week: 0.0 standard drinks   Drug use: No   Sexual activity: Not on file  Other Topics Concern   Not on file  Social History Narrative   Not on file   Social Determinants of Health   Financial Resource Strain: Low Risk    Difficulty of Paying Living Expenses: Not hard at all  Food Insecurity: No Food Insecurity   Worried About Programme researcher, broadcasting/film/video in the Last Year: Never true   Ran Out of Food in the Last Year: Never true  Transportation Needs: No Transportation Needs   Lack of Transportation (Medical): No   Lack of Transportation (Non-Medical): No  Physical Activity: Inactive   Days of Exercise per Week: 0 days   Minutes of Exercise per Session: 0 min  Stress: No Stress Concern Present   Feeling of Stress : Not at all  Social Connections: Unknown   Frequency of Communication with Friends and Family: More than three times a week   Frequency of Social Gatherings with Friends and Family: Once a week   Attends Religious Services: Never   Database administrator or Organizations: No   Attends Engineer, structural: Never   Marital Status: Patient refused    Tobacco Counseling Counseling given: Not Answered   Clinical Intake:  Pre-visit preparation completed: Yes  Pain : No/denies pain     Diabetes: Yes CBG done?: No Did pt. bring in CBG monitor from home?: No  How often do you need to have someone help you when you read instructions, pamphlets, or other written materials from your doctor or pharmacy?: 1 - Never What is the last grade level you completed in school?: 2 years of college  Diabetic? yes  Interpreter Needed?: No  Information entered by :: Susie Cassette, LPN   Activities of Daily Living In your present state of health, do you have any  difficulty performing the following activities: 01/25/2021 06/11/2020  Hearing? N Y  Comment wears hearing aids -  Vision? N N  Difficulty concentrating or making decisions? N N  Walking or climbing stairs? N Y  Dressing or bathing? N N  Doing errands, shopping? N N  Preparing Food and eating ? N -  Using the Toilet? N -  In the past six months, have you accidently leaked urine? Y -  Comment wears daily protection -  Do you have problems with loss of bowel control? Y -  Comment wears daily protection -  Managing your Medications? N -  Managing your Finances? N -  Housekeeping or managing your Housekeeping? N -  Some recent data might be hidden    Patient Care Team: Myrlene Broker, MD as PCP -  General (Internal Medicine) Marzella SchleinWood, Craig H., MD as Consulting Physician (Ophthalmology)  Indicate any recent Medical Services you may have received from other than [facility] providers in the past year (date may be approximate).     Assessment:   This is a routine wellness examination for Katrina Simmons.  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 Vision Screening - Comments:: Patient wears glasses. Eye exam done once a year by Dr. Velna Ochsraig Wood.  Dietary issues and exercise activities discussed: Current Exercise Habits: The patient does not participate in regular exercise at present   Goals Addressed               This Visit's Progress     Patient Stated (pt-stated)        No goals at this time.      Depression Screen PHQ 2/9 Scores 01/25/2021 06/11/2020 03/08/2019 04/27/2017 11/02/2015  PHQ - 2 Score 0 0 0 1 0    Fall Risk Fall Risk  01/25/2021 06/11/2020 03/12/2020 03/08/2019 04/27/2017  Falls in the past year? 0 0 0 0 No  Number falls in past yr: 0 0 0 - -  Injury with Fall? 0 0 0 - -  Risk for fall due to : No Fall Risks Impaired mobility No Fall Risks - -  Follow up Falls evaluation completed Falls evaluation  completed Falls evaluation completed - -    FALL RISK PREVENTION PERTAINING TO THE HOME:  Any stairs in or around the home? No  If so, are there any without handrails? No  Home free of loose throw rugs in walkways, pet beds, electrical cords, etc? Yes  Adequate lighting in your home to reduce risk of falls? Yes   ASSISTIVE DEVICES UTILIZED TO PREVENT FALLS:  Life alert? No  Use of a cane, walker or w/c? No  Grab bars in the bathroom? Yes  Shower chair or bench in shower? Yes  Elevated toilet seat or a handicapped toilet? Yes   TIMED UP AND GO:  Was the test performed? No .  Length of time to ambulate 10 feet: 0 sec.   Gait steady and fast without use of assistive device  Cognitive Function: No flowsheet data found.         Immunizations Immunization History  Administered Date(s) Administered   PFIZER(Purple Top)SARS-COV-2 Vaccination 10/08/2019, 07/19/2020, 10/28/2020   Pneumococcal Conjugate-13 09/12/2014    TDAP status: Due, Education has been provided regarding the importance of this vaccine. Advised may receive this vaccine at local pharmacy or Health Dept. Aware to provide a copy of the vaccination record if obtained from local pharmacy or Health Dept. Verbalized acceptance and understanding.  Flu Vaccine status: Due, Education has been provided regarding the importance of this vaccine. Advised may receive this vaccine at local pharmacy or Health Dept. Aware to provide a copy of the vaccination record if obtained from local pharmacy or Health Dept. Verbalized acceptance and understanding.  Pneumococcal vaccine status: Due, Education has been provided regarding the importance of this vaccine. Advised may receive this vaccine at local pharmacy or Health Dept. Aware to provide a copy of the vaccination record if obtained from local pharmacy or Health Dept. Verbalized acceptance and understanding.  Covid-19 vaccine status: Completed vaccines  Qualifies for Shingles  Vaccine? Yes   Zostavax completed No   Shingrix Completed?: No.    Education has been provided regarding the importance of this vaccine. Patient has been advised to call insurance company to determine  out of pocket expense if they have not yet received this vaccine. Advised may also receive vaccine at local pharmacy or Health Dept. Verbalized acceptance and understanding.  Screening Tests Health Maintenance  Topic Date Due   Zoster Vaccines- Shingrix (1 of 2) Never done   DEXA SCAN  Never done   PNA vac Low Risk Adult (2 of 2 - PPSV23) 09/12/2015   OPHTHALMOLOGY EXAM  09/12/2016   URINE MICROALBUMIN  04/27/2018   TETANUS/TDAP  06/11/2021 (Originally 01/24/1960)   INFLUENZA VACCINE  02/11/2021   COVID-19 Vaccine (4 - Booster for Pfizer series) 02/27/2021   FOOT EXAM  06/11/2021   HPV VACCINES  Aged Out    Health Maintenance  Health Maintenance Due  Topic Date Due   Zoster Vaccines- Shingrix (1 of 2) Never done   DEXA SCAN  Never done   PNA vac Low Risk Adult (2 of 2 - PPSV23) 09/12/2015   OPHTHALMOLOGY EXAM  09/12/2016   URINE MICROALBUMIN  04/27/2018    Colorectal cancer screening: No longer required.   Mammogram status: Completed 05/18/2020. Repeat every year  Bone Density status: Ordered 11/12/2020. Pt provided with contact info and advised to call to schedule appt.  Lung Cancer Screening: (Low Dose CT Chest recommended if Age 20-80 years, 30 pack-year currently smoking OR have quit w/in 15years.) does not qualify.   Lung Cancer Screening Referral: no  Additional Screening:  Hepatitis C Screening: does not qualify; Completed no  Vision Screening: Recommended annual ophthalmology exams for early detection of glaucoma and other disorders of the eye. Is the patient up to date with their annual eye exam?  Yes  Who is the provider or what is the name of the office in which the patient attends annual eye exams? Dr. Velna Ochs If pt is not established with a provider, would they  like to be referred to a provider to establish care? No .   Dental Screening: Recommended annual dental exams for proper oral hygiene  Community Resource Referral / Chronic Care Management: CRR required this visit?  No   CCM required this visit?  No      Plan:     I have personally reviewed and noted the following in the patient's chart:   Medical and social history Use of alcohol, tobacco or illicit drugs  Current medications and supplements including opioid prescriptions.  Functional ability and status Nutritional status Physical activity Advanced directives List of other physicians Hospitalizations, surgeries, and ER visits in previous 12 months Vitals Screenings to include cognitive, depression, and falls Referrals and appointments  In addition, I have reviewed and discussed with patient certain preventive protocols, quality metrics, and best practice recommendations. A written personalized care plan for preventive services as well as general preventive health recommendations were provided to patient.     Mickeal Needy, LPN   10/10/760   Nurse Notes:  Patient is cogitatively intact. There were no vitals filed for this visit. There is no height or weight on file to calculate BMI. Patient stated that she has no issues with gait or balance; does not use any assistive devices. Medications reviewed with patient; no opioid use noted.

## 2021-01-25 NOTE — Patient Instructions (Addendum)
Katrina Simmons , Thank you for taking time to come for your Medicare Wellness Visit. I appreciate your ongoing commitment to your health goals. Please review the following plan we discussed and let me know if I can assist you in the future.   Screening recommendations/referrals: Colonoscopy: no repeat due to age Mammogram: 05/18/2020; due every 1-2 years Bone Density: ordered 11/12/2020 Recommended yearly ophthalmology/optometry visit for glaucoma screening and checkup Recommended yearly dental visit for hygiene and checkup  Vaccinations: Influenza vaccine: declined Pneumococcal vaccine: 09/12/2014; need Pneumovax 23 Tdap vaccine: declined Shingles vaccine: declined   Covid-19: 10/08/2019, 10/29/2019, 07/19/2020  Advanced directives: Advance directive discussed with you today. Even though you declined this today please call our office should you change your mind and we can give you the proper paperwork for you to fill out.  Conditions/risks identified: Yes.  Patient stated that she has no healthcare goals for this year.  Next appointment: Please schedule your next Medicare Wellness Visit with your Nurse Health Advisor in 1 year by calling [phone].   Preventive Care 82 Years and Older, Female Preventive care refers to lifestyle choices and visits with your health care provider that can promote health and wellness. What does preventive care include? A yearly physical exam. This is also called an annual well check. Dental exams once or twice a year. Routine eye exams. Ask your health care provider how often you should have your eyes checked. Personal lifestyle choices, including: Daily care of your teeth and gums. Regular physical activity. Eating a healthy diet. Avoiding tobacco and drug use. Limiting alcohol use. Practicing safe sex. Taking low-dose aspirin every day. Taking vitamin and mineral supplements as recommended by your health care provider. What happens during an annual well  check? The services and screenings done by your health care provider during your annual well check will depend on your age, overall health, lifestyle risk factors, and family history of disease. Counseling  Your health care provider may ask you questions about your: Alcohol use. Tobacco use. Drug use. Emotional well-being. Home and relationship well-being. Sexual activity. Eating habits. History of falls. Memory and ability to understand (cognition). Work and work Astronomer. Reproductive health. Screening  You may have the following tests or measurements: Height, weight, and BMI. Blood pressure. Lipid and cholesterol levels. These may be checked every 5 years, or more frequently if you are over 53 years old. Skin check. Lung cancer screening. You may have this screening every year starting at age 82 if you have a 30-pack-year history of smoking and currently smoke or have quit within the past 15 years. Fecal occult blood test (FOBT) of the stool. You may have this test every year starting at age 82 Flexible sigmoidoscopy or colonoscopy. You may have a sigmoidoscopy every 5 years or a colonoscopy every 10 years starting at age 82 Hepatitis C blood test. Hepatitis B blood test. Sexually transmitted disease (STD) testing. Diabetes screening. This is done by checking your blood sugar (glucose) after you have not eaten for a while (fasting). You may have this done every 1-3 years. Bone density scan. This is done to screen for osteoporosis. You may have this done starting at age 82 Mammogram. This may be done every 1-2 years. Talk to your health care provider about how often you should have regular mammograms. Talk with your health care provider about your test results, treatment options, and if necessary, the need for more tests. Vaccines  Your health care provider may recommend certain vaccines, such as: Influenza  vaccine. This is recommended every year. Tetanus, diphtheria, and  acellular pertussis (Tdap, Td) vaccine. You may need a Td booster every 10 years. Zoster vaccine. You may need this after age 82 Pneumococcal 13-valent conjugate (PCV13) vaccine. One dose is recommended after age 82 Pneumococcal polysaccharide (PPSV23) vaccine. One dose is recommended after age 82. Talk to your health care provider about which screenings and vaccines you need and how often you need them. This information is not intended to replace advice given to you by your health care provider. Make sure you discuss any questions you have with your health care provider. Document Released: 07/27/2015 Document Revised: 03/19/2016 Document Reviewed: 05/01/2015 Elsevier Interactive Patient Education  2017 [employer] Prevention in the Home Falls can cause injuries. They can happen to people of all ages. There are many things you can do to make your home safe and to help prevent falls. What can I do on the outside of my home? Regularly fix the edges of walkways and driveways and fix any cracks. Remove anything that might make you trip as you walk through a door, such as a raised step or threshold. Trim any bushes or trees on the path to your home. Use bright outdoor lighting. Clear any walking paths of anything that might make someone trip, such as rocks or tools. Regularly check to see if handrails are loose or broken. Make sure that both sides of any steps have handrails. Any raised decks and porches should have guardrails on the edges. Have any leaves, snow, or ice cleared regularly. Use sand or salt on walking paths during winter. Clean up any spills in your garage right away. This includes oil or grease spills. What can I do in the bathroom? Use night lights. Install grab bars by the toilet and in the tub and shower. Do not use towel bars as grab bars. Use non-skid mats or decals in the tub or shower. If you need to sit down in the shower, use a plastic, non-slip stool. Keep  the floor dry. Clean up any water that spills on the floor as soon as it happens. Remove soap buildup in the tub or shower regularly. Attach bath mats securely with double-sided non-slip rug tape. Do not have throw rugs and other things on the floor that can make you trip. What can I do in the bedroom? Use night lights. Make sure that you have a light by your bed that is easy to reach. Do not use any sheets or blankets that are too big for your bed. They should not hang down onto the floor. Have a firm chair that has side arms. You can use this for support while you get dressed. Do not have throw rugs and other things on the floor that can make you trip. What can I do in the kitchen? Clean up any spills right away. Avoid walking on wet floors. Keep items that you use a lot in easy-to-reach places. If you need to reach something above you, use a strong step stool that has a grab bar. Keep electrical cords out of the way. Do not use floor polish or wax that makes floors slippery. If you must use wax, use non-skid floor wax. Do not have throw rugs and other things on the floor that can make you trip. What can I do with my stairs? Do not leave any items on the stairs. Make sure that there are handrails on both sides of the stairs and use them. Fix  handrails that are broken or loose. Make sure that handrails are as long as the stairways. Check any carpeting to make sure that it is firmly attached to the stairs. Fix any carpet that is loose or worn. Avoid having throw rugs at the top or bottom of the stairs. If you do have throw rugs, attach them to the floor with carpet tape. Make sure that you have a light switch at the top of the stairs and the bottom of the stairs. If you do not have them, ask someone to add them for you. What else can I do to help prevent falls? Wear shoes that: Do not have high heels. Have rubber bottoms. Are comfortable and fit you well. Are closed at the toe. Do not  wear sandals. If you use a stepladder: Make sure that it is fully opened. Do not climb a closed stepladder. Make sure that both sides of the stepladder are locked into place. Ask someone to hold it for you, if possible. Clearly mark and make sure that you can see: Any grab bars or handrails. First and last steps. Where the edge of each step is. Use tools that help you move around (mobility aids) if they are needed. These include: Canes. Walkers. Scooters. Crutches. Turn on the lights when you go into a dark area. Replace any light bulbs as soon as they burn out. Set up your furniture so you have a clear path. Avoid moving your furniture around. If any of your floors are uneven, fix them. If there are any pets around you, be aware of where they are. Review your medicines with your doctor. Some medicines can make you feel dizzy. This can increase your chance of falling. Ask your doctor what other things that you can do to help prevent falls. This information is not intended to replace advice given to you by your health care provider. Make sure you discuss any questions you have with your health care provider. Document Released: 04/26/2009 Document Revised: 12/06/2015 Document Reviewed: 08/04/2014 Elsevier Interactive Patient Education  2017 [employer].

## 2021-01-29 ENCOUNTER — Telehealth: Payer: Self-pay | Admitting: Internal Medicine

## 2021-01-29 NOTE — Progress Notes (Signed)
  Care Management   Follow Up Note   01/29/2021 Name: Katrina Simmons MRN: [id number] DOB: [birth year]   Referred by: Myrlene Broker, MD Reason for referral : No chief complaint on file.   An unsuccessful telephone outreach was attempted today. The patient was referred to the case management team for assistance with care management and care coordination.   Follow Up Plan: The care management team will reach out to the patient again over the next 7 days.   Leonides Schanz Upstream Scheduler

## 2021-02-05 ENCOUNTER — Telehealth: Payer: Self-pay | Admitting: Internal Medicine

## 2021-02-05 NOTE — Chronic Care Management (AMB) (Signed)
  Chronic Care Management   Outreach Note  02/05/2021 Name: Nichele Slawson MRN: [id number] DOB: [birth year]  Referred by: Myrlene Broker, MD Reason for referral : No chief complaint on file.   A second unsuccessful telephone outreach was attempted today. The patient was referred to pharmacist for assistance with care management and care coordination.  Follow Up Plan:   Carmell Austria Upstream Scheduler

## 2021-06-05 ENCOUNTER — Other Ambulatory Visit: Payer: Self-pay | Admitting: Internal Medicine

## 2021-06-05 NOTE — Telephone Encounter (Signed)
1.Medication Requested: amLODipine (NORVASC) 5 MG tablet  2. Pharmacy (Name, Street, [place]): [facility] [address] [place], [place] - [address] Phone:  [phone]  Fax:  [phone]     3. On Med List: Yes  4. Last Visit with PCP: 5.2.2022  5. Next visit date with PCP: n/a   Agent: Please be advised that RX refills may take up to 3 business days. We ask that you follow-up with your pharmacy.

## 2021-06-19 IMAGING — CT CT HEAD W/O CM
1 series · 16 of 30 positions shown, 20 images · non-contrast
Comparison: None.

CLINICAL DATA: Headache, recent injury to head from garage door

EXAM:
CT HEAD WITHOUT CONTRAST
TECHNIQUE: Contiguous axial images were obtained from the base of the skull
through the vertex without intravenous contrast.

[Series 2: head w/(date) · axial · 0.46mm/px · z∈[-183,-28]mm · 16 of 35 slices shown, 20 images]
[im 2/35  brain]
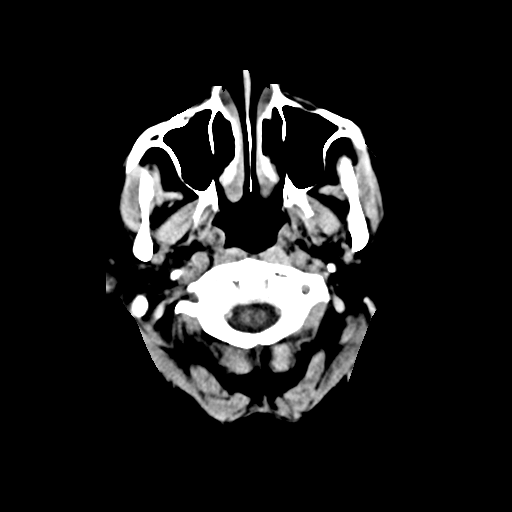
[im 2/35  bone]
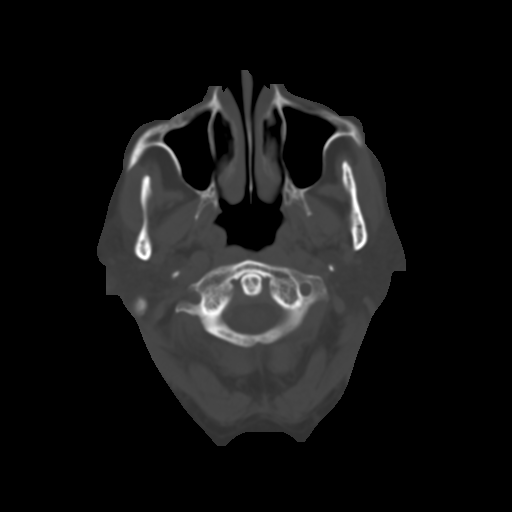
[im 4/35  brain]
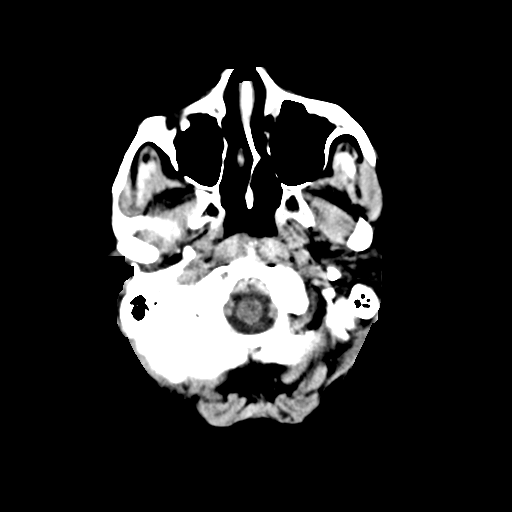
[im 6/35  brain]
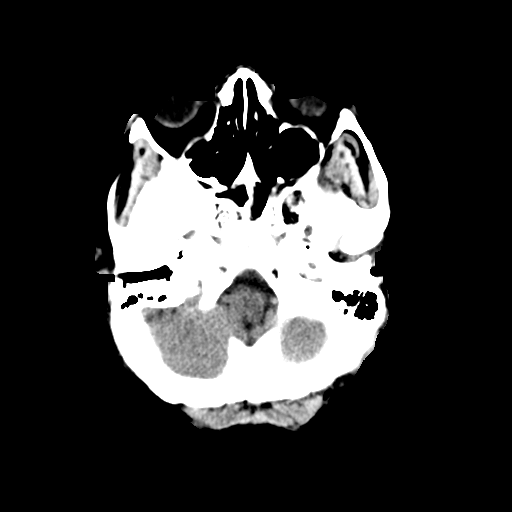
[im 9/35  brain]
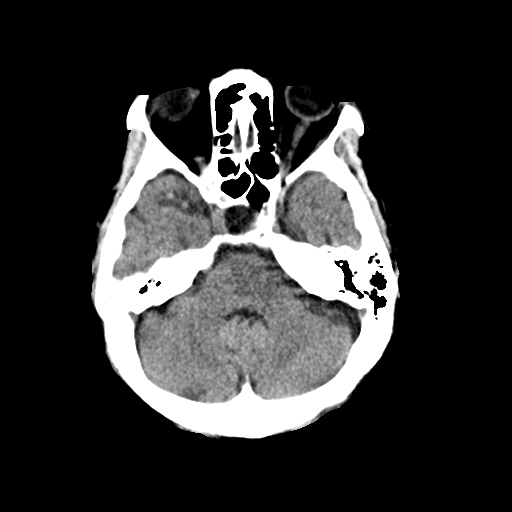
[im 10/35  brain]
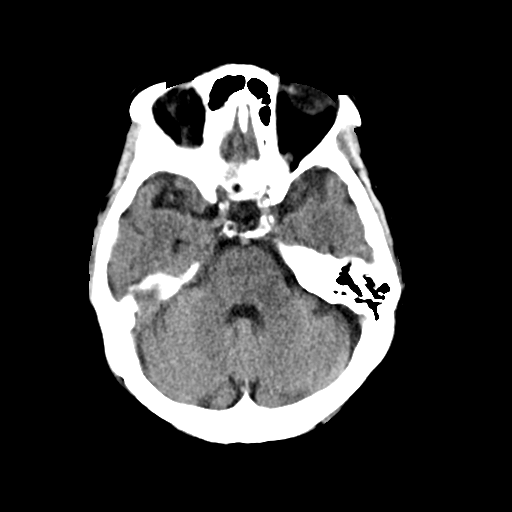
[im 10/35  bone]
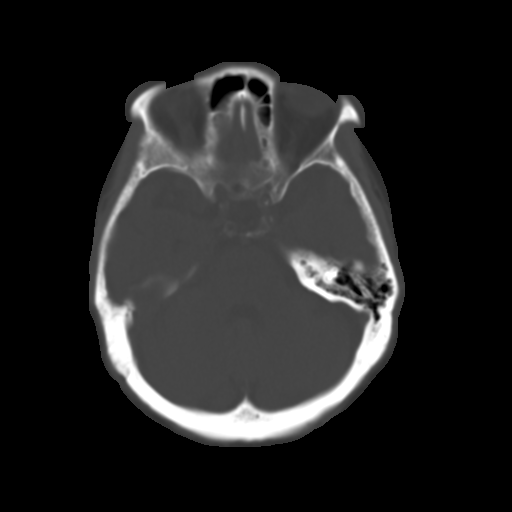
[im 12/35  brain]
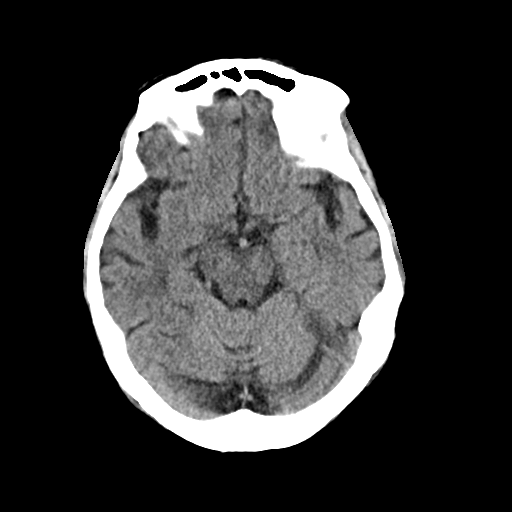
[im 15/35  brain]
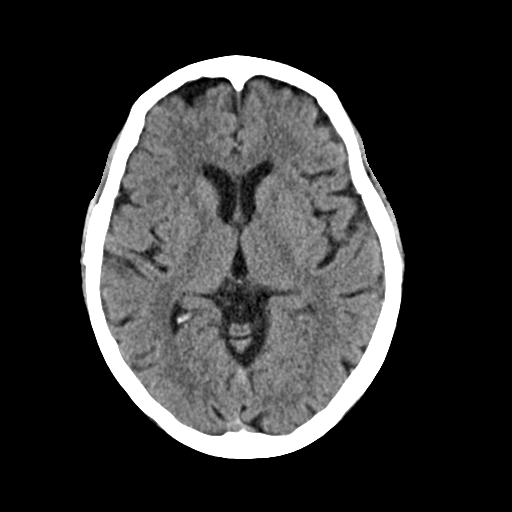
[im 17/35  brain]
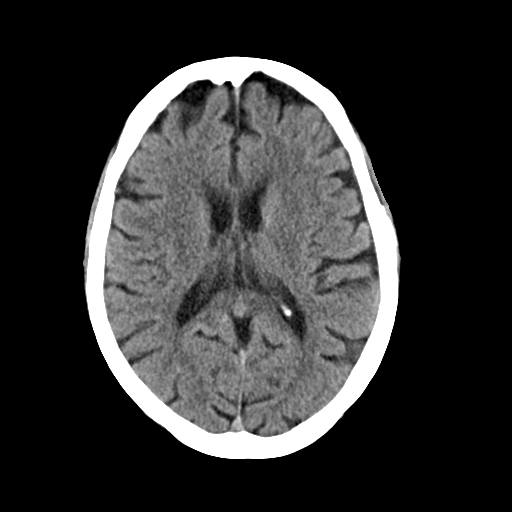
[im 18/35  brain]
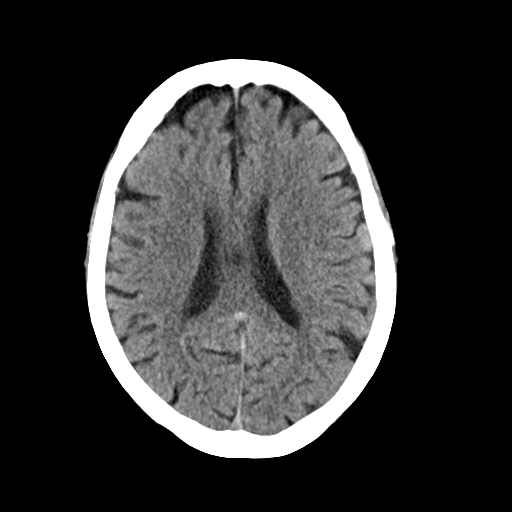
[im 18/35  bone]
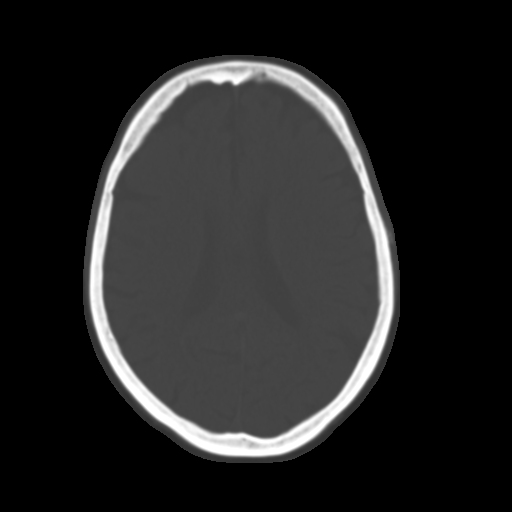
[im 20/35  brain]
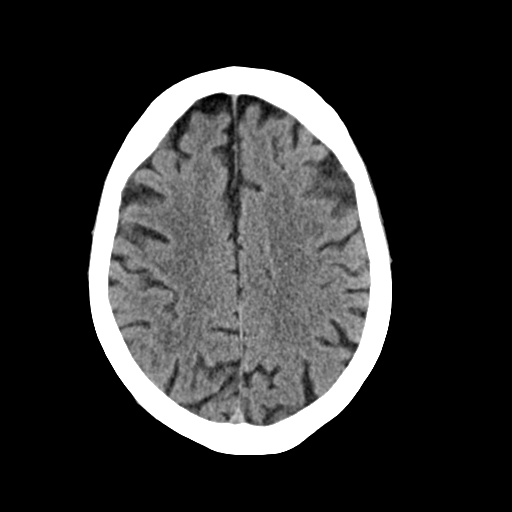
[im 23/35  brain]
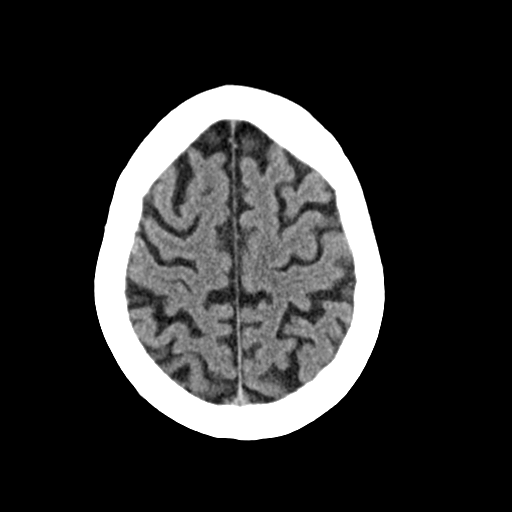
[im 25/35  brain]
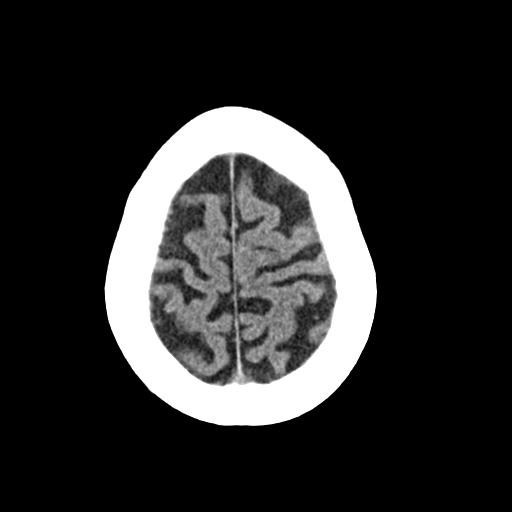
[im 26/35  brain]
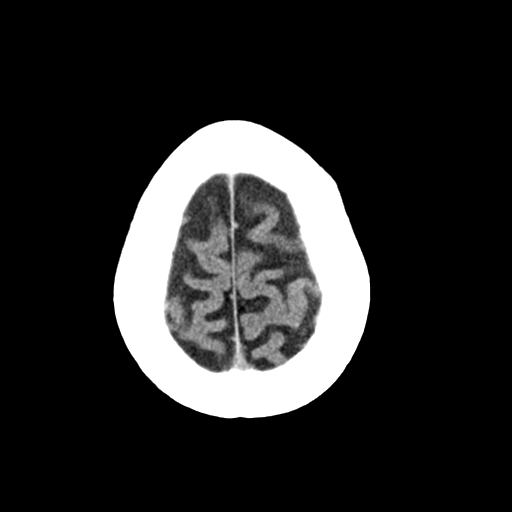
[im 26/35  bone]
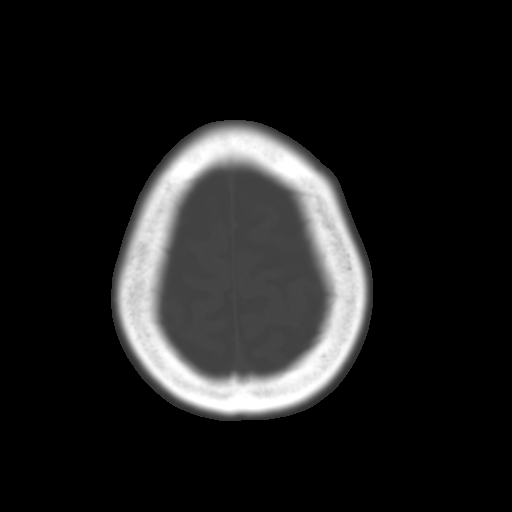
[im 29/35  brain]
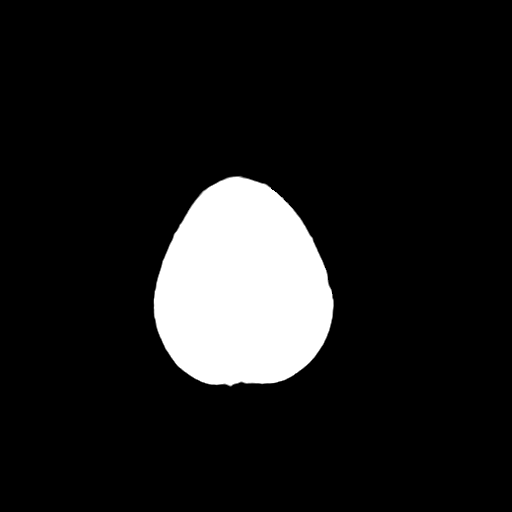
[im 31/35  brain]
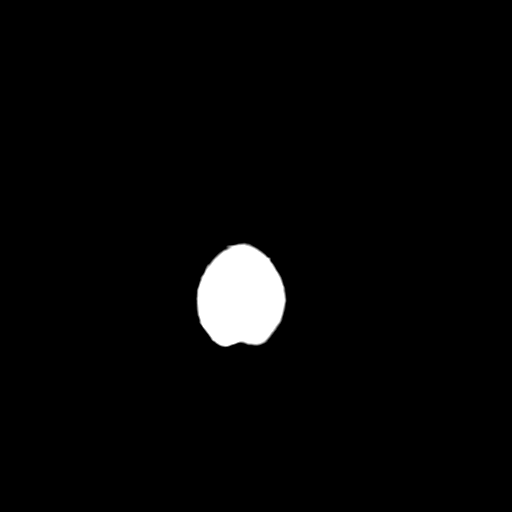
[im 33/35  brain]
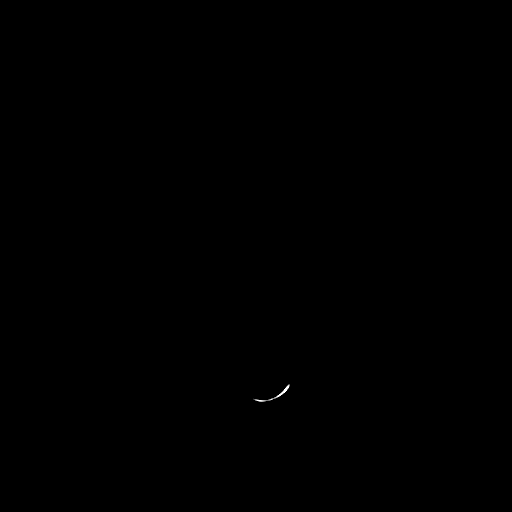

[16 of 30 positions shown; findings below may reference images not displayed]

FINDINGS: Brain: No evidence of acute infarction, hemorrhage, hydrocephalus,
extra-axial collection or mass lesion/mass effect. Mild cerebral and
cerebellar cortical atrophy.

Vascular: No hyperdense vessel or unexpected calcification.

Skull: Normal. Negative for fracture or focal lesion.

Sinuses/Orbits: No acute finding.

Other: None.
IMPRESSION: Negative head CT.

Cerebral and cerebellar cortical volume loss commensurate with age.

## 2021-06-24 ENCOUNTER — Encounter: Payer: Self-pay | Admitting: Internal Medicine

## 2021-06-24 ENCOUNTER — Ambulatory Visit (INDEPENDENT_AMBULATORY_CARE_PROVIDER_SITE_OTHER): Payer: Medicare Other | Admitting: Internal Medicine

## 2021-06-24 ENCOUNTER — Other Ambulatory Visit: Payer: Self-pay

## 2021-06-24 VITALS — BP 130/74 | HR 112 | Resp 18 | Ht 62.0 in | Wt 166.2 lb

## 2021-06-24 DIAGNOSIS — E118 Type 2 diabetes mellitus with unspecified complications: Secondary | ICD-10-CM

## 2021-06-24 DIAGNOSIS — Z0000 Encounter for general adult medical examination without abnormal findings: Secondary | ICD-10-CM | POA: Diagnosis not present

## 2021-06-24 DIAGNOSIS — E785 Hyperlipidemia, unspecified: Secondary | ICD-10-CM | POA: Diagnosis not present

## 2021-06-24 DIAGNOSIS — I10 Essential (primary) hypertension: Secondary | ICD-10-CM | POA: Diagnosis not present

## 2021-06-24 DIAGNOSIS — E1169 Type 2 diabetes mellitus with other specified complication: Secondary | ICD-10-CM

## 2021-06-24 DIAGNOSIS — N393 Stress incontinence (female) (male): Secondary | ICD-10-CM

## 2021-06-24 LAB — GLUCOSE, POCT (MANUAL RESULT ENTRY): POC Glucose: 136 mg/dl — AB (ref 70–99)

## 2021-06-24 NOTE — Patient Instructions (Signed)
I would recommend to add tylenol 1000 mg 3 times a day to help.

## 2021-06-24 NOTE — Progress Notes (Signed)
   Subjective:   Patient ID: Katrina Simmons, female    DOB: 06-Apr-1941, 80 y.o.   MRN: [id number]  HPI The patient is an 80 YO female coming in for physical.  PMH, FMH, social history reviewed and updated  Review of Systems  Constitutional: Negative.   HENT: Negative.    Eyes: Negative.   Respiratory:  Negative for cough, chest tightness and shortness of breath.   Cardiovascular:  Negative for chest pain, palpitations and leg swelling.  Gastrointestinal:  Negative for abdominal distention, abdominal pain, constipation, diarrhea, nausea and vomiting.  Musculoskeletal:  Positive for arthralgias, gait problem and myalgias.  Skin: Negative.   Neurological:  Negative for light-headedness.  Psychiatric/Behavioral: Negative.     Objective:  Physical Exam Constitutional:      Appearance: She is well-developed.  HENT:     Head: Normocephalic and atraumatic.  Cardiovascular:     Rate and Rhythm: Normal rate and regular rhythm.  Pulmonary:     Effort: Pulmonary effort is normal. No respiratory distress.     Breath sounds: Normal breath sounds. No wheezing or rales.  Abdominal:     General: Bowel sounds are normal. There is no distension.     Palpations: Abdomen is soft.     Tenderness: There is no abdominal tenderness. There is no rebound.  Musculoskeletal:        General: Tenderness present.     Cervical back: Normal range of motion.  Skin:    General: Skin is warm and dry.  Neurological:     Mental Status: She is alert and oriented to person, place, and time.     Coordination: Coordination normal.    Vitals:   06/24/21 1547  BP: 130/74  Pulse: (!) 112  Resp: 18  SpO2: 99%  Weight: 166 lb 3.2 oz (75.4 kg)  Height: 5\' 2"  (1.575 m)   This visit occurred during the SARS-CoV-2 public health emergency.  Safety protocols were in place, including screening questions prior to the visit, additional usage of staff PPE, and extensive cleaning of exam room while observing  appropriate contact time as indicated for disinfecting solutions.   Assessment & Plan:

## 2021-06-25 LAB — HEMOGLOBIN A1C: Hgb A1c MFr Bld: 6.6 % — ABNORMAL HIGH (ref 4.6–6.5)

## 2021-06-25 LAB — COMPREHENSIVE METABOLIC PANEL
ALT: 11 U/L (ref 0–35)
AST: 19 U/L (ref 0–37)
Albumin: 4.3 g/dL (ref 3.5–5.2)
Alkaline Phosphatase: 83 U/L (ref 39–117)
BUN: 18 mg/dL (ref 6–23)
CO2: 26 mEq/L (ref 19–32)
Calcium: 9.7 mg/dL (ref 8.4–10.5)
Chloride: 102 mEq/L (ref 96–112)
Creatinine, Ser: 0.78 mg/dL (ref 0.40–1.20)
GFR: 70.74 mL/min (ref >60.00)
Glucose, Bld: 126 mg/dL — ABNORMAL HIGH (ref 70–99)
Potassium: 3.5 mEq/L (ref 3.5–5.1)
Sodium: 139 mEq/L (ref 135–145)
Total Bilirubin: 0.5 mg/dL (ref 0.2–1.2)
Total Protein: 7.8 g/dL (ref 6.0–8.3)

## 2021-06-25 LAB — LIPID PANEL
Cholesterol: 163 mg/dL (ref 0–200)
HDL: 57.5 mg/dL (ref >39.00)
LDL Cholesterol: 67 mg/dL (ref 0–99)
NonHDL: 105.16
Total CHOL/HDL Ratio: 3
Triglycerides: 189 mg/dL — ABNORMAL HIGH (ref 0.0–149.0)
VLDL: 37.8 mg/dL (ref 0.0–40.0)

## 2021-06-25 LAB — MICROALBUMIN / CREATININE URINE RATIO
Creatinine,U: 153.6 mg/dL
Microalb Creat Ratio: 1.5 mg/g (ref 0.0–30.0)
Microalb, Ur: 2.4 mg/dL — ABNORMAL HIGH (ref 0.0–1.9)

## 2021-06-25 LAB — CBC
HCT: 39.1 % (ref 36.0–46.0)
Hemoglobin: 13 g/dL (ref 12.0–15.0)
MCHC: 33.4 g/dL (ref 30.0–36.0)
MCV: 88 fl (ref 78.0–100.0)
Platelets: 213 10*3/uL (ref 150.0–400.0)
RBC: 4.44 Mil/uL (ref 3.87–5.11)
RDW: 14.1 % (ref 11.5–15.5)
WBC: 7.2 10*3/uL (ref 4.0–10.5)

## 2021-06-28 ENCOUNTER — Encounter: Payer: Self-pay | Admitting: Internal Medicine

## 2021-06-28 NOTE — Assessment & Plan Note (Signed)
Checking lipid panel and adjust as needed her simvastatin 20 mg daily.

## 2021-06-28 NOTE — Assessment & Plan Note (Signed)
Taking oxybutynin 5 mg BID prn for this. Overall satisfied with control.

## 2021-06-28 NOTE — Assessment & Plan Note (Signed)
Flu shot declines. Covid-19 declines. Pneumonia declines. Shingrix declines. Tetanus declines. Colonoscopy aged out. Mammogram aged out, pap smear aged out and dexa declines. Counseled about sun safety and mole surveillance. Counseled about the dangers of distracted driving. Given 10 year screening recommendations.

## 2021-06-28 NOTE — Assessment & Plan Note (Signed)
CBG done and good. Checking HgA1c, lipid panel. Foot exam done. Reminded about eye exam. Taking glipizide 5 mg daily and on statin. Not on ACE-I/ARB so checking microalbumin to creatinine ratio urine. Adjust as needed. No low sugars.

## 2021-06-28 NOTE — Assessment & Plan Note (Signed)
Checking CMP and adjust amlodipine 5 mg daily as needed. BP at goal.  

## 2021-07-04 ENCOUNTER — Other Ambulatory Visit: Payer: Self-pay | Admitting: Internal Medicine

## 2021-07-04 NOTE — Telephone Encounter (Signed)
Requesting refill authorization for glipiZIDE (GLUCOTROL XL) 5 MG 24 hr tablet and amLODipine (NORVASC) 5 MG tablet. Requesting 90 day supply

## 2021-07-05 ENCOUNTER — Other Ambulatory Visit: Payer: Self-pay

## 2021-07-05 NOTE — Telephone Encounter (Signed)
Patient calling in  Checking status of refill request  Advised patient of 3BD timeframe

## 2021-09-12 ENCOUNTER — Other Ambulatory Visit: Payer: Self-pay | Admitting: Internal Medicine

## 2021-09-30 ENCOUNTER — Telehealth: Payer: Self-pay | Admitting: Internal Medicine

## 2021-09-30 MED ORDER — AMLODIPINE BESYLATE 5 MG PO TABS: 5.0000 mg | ORAL_TABLET | Freq: Every day | ORAL | 0 refills | Status: DC

## 2021-09-30 MED ORDER — GLIPIZIDE ER 5 MG PO TB24: 5.0000 mg | ORAL_TABLET | Freq: Every day | ORAL | 0 refills | Status: DC

## 2021-09-30 NOTE — Telephone Encounter (Signed)
Patient needs Amlodopine and Glipizide refills sent in.  Patient states that she is down to 5 pills. ?Please send to [facility] on [address]. In [place] ?

## 2021-09-30 NOTE — Telephone Encounter (Signed)
Refills have been sent to the pt's pharmacy  

## 2021-10-30 DIAGNOSIS — M1711 Unilateral primary osteoarthritis, right knee: Secondary | ICD-10-CM | POA: Diagnosis not present

## 2021-10-30 DIAGNOSIS — M1712 Unilateral primary osteoarthritis, left knee: Secondary | ICD-10-CM | POA: Diagnosis not present

## 2021-12-12 DIAGNOSIS — M170 Bilateral primary osteoarthritis of knee: Secondary | ICD-10-CM | POA: Diagnosis not present

## 2021-12-12 DIAGNOSIS — M1711 Unilateral primary osteoarthritis, right knee: Secondary | ICD-10-CM | POA: Diagnosis not present

## 2021-12-12 DIAGNOSIS — M1712 Unilateral primary osteoarthritis, left knee: Secondary | ICD-10-CM | POA: Diagnosis not present

## 2021-12-16 ENCOUNTER — Telehealth: Payer: Self-pay | Admitting: Internal Medicine

## 2021-12-16 MED ORDER — SIMVASTATIN 20 MG PO TABS: ORAL_TABLET | ORAL | 0 refills | Status: DC

## 2021-12-16 NOTE — Telephone Encounter (Signed)
Patient called requesting refill for Simvastatin 20 mg be sent in to the [facility] on [address].

## 2021-12-16 NOTE — Telephone Encounter (Signed)
Refill has been sent to the pt's pharmacy  

## 2021-12-19 DIAGNOSIS — M1712 Unilateral primary osteoarthritis, left knee: Secondary | ICD-10-CM | POA: Diagnosis not present

## 2021-12-19 DIAGNOSIS — M170 Bilateral primary osteoarthritis of knee: Secondary | ICD-10-CM | POA: Diagnosis not present

## 2021-12-19 DIAGNOSIS — M1711 Unilateral primary osteoarthritis, right knee: Secondary | ICD-10-CM | POA: Diagnosis not present

## 2021-12-22 ENCOUNTER — Other Ambulatory Visit: Payer: Self-pay | Admitting: Internal Medicine

## 2021-12-26 DIAGNOSIS — M1711 Unilateral primary osteoarthritis, right knee: Secondary | ICD-10-CM | POA: Diagnosis not present

## 2021-12-26 DIAGNOSIS — M170 Bilateral primary osteoarthritis of knee: Secondary | ICD-10-CM | POA: Diagnosis not present

## 2021-12-26 DIAGNOSIS — M1712 Unilateral primary osteoarthritis, left knee: Secondary | ICD-10-CM | POA: Diagnosis not present

## 2022-01-20 ENCOUNTER — Telehealth: Payer: Self-pay | Admitting: Internal Medicine

## 2022-01-20 NOTE — Telephone Encounter (Signed)
N/A -Unable to leave a message for patient to call back to schedule Medicare Annual Wellness Visit   Last AWV  01/25/21  Please schedule at anytime with [facility] Advisor if patient calls the office back.     Any questions, please call me at [phone]

## 2022-01-24 ENCOUNTER — Ambulatory Visit (INDEPENDENT_AMBULATORY_CARE_PROVIDER_SITE_OTHER): Payer: Medicare Other

## 2022-01-24 DIAGNOSIS — Z0000 Encounter for general adult medical examination without abnormal findings: Secondary | ICD-10-CM | POA: Diagnosis not present

## 2022-01-24 NOTE — Patient Instructions (Addendum)
Katrina Simmons , Thank you for taking time to come for your Medicare Wellness Visit. I appreciate your ongoing commitment to your health goals. Please review the following plan we discussed and let me know if I can assist you in the future.   Screening recommendations/referrals: Colonoscopy: No longer recommended due to age. Mammogram: 05/18/2020; due every 1-2 years (patient will schedule with daughter) Bone Density: never done Recommended yearly ophthalmology/optometry visit for glaucoma screening and checkup Recommended yearly dental visit for hygiene and checkup  Vaccinations: Influenza vaccine: declined Pneumococcal vaccine: 09/12/2014 Tdap vaccine: declined Shingles vaccine: declined   Covid-19: 3/27/201, 07/19/2020, 10/28/2020  Advanced directives: Yes; Please bring a copy of your health care power of attorney and living will to the office at your convenience.  Conditions/risks identified: Yes; Type II Diabetes Mellitus  Next appointment: 01/26/2023 at 9:45 a.m. telephone visit with Percell Miller, Nurse Health Advisor.  If you need to reschedule or cancel, please call [phone].   Preventive Care 85 Years and Older, Female Preventive care refers to lifestyle choices and visits with your health care provider that can promote health and wellness. What does preventive care include? A yearly physical exam. This is also called an annual well check. Dental exams once or twice a year. Routine eye exams. Ask your health care provider how often you should have your eyes checked. Personal lifestyle choices, including: Daily care of your teeth and gums. Regular physical activity. Eating a healthy diet. Avoiding tobacco and drug use. Limiting alcohol use. Practicing safe sex. Taking low-dose aspirin every day. Taking vitamin and mineral supplements as recommended by your health care provider. What happens during an annual well check? The services and screenings done by your health care  provider during your annual well check will depend on your age, overall health, lifestyle risk factors, and family history of disease. Counseling  Your health care provider may ask you questions about your: Alcohol use. Tobacco use. Drug use. Emotional well-being. Home and relationship well-being. Sexual activity. Eating habits. History of falls. Memory and ability to understand (cognition). Work and work Astronomer. Reproductive health. Screening  You may have the following tests or measurements: Height, weight, and BMI. Blood pressure. Lipid and cholesterol levels. These may be checked every 5 years, or more frequently if you are over 74 years old. Skin check. Lung cancer screening. You may have this screening every year starting at age 11 if you have a 30-pack-year history of smoking and currently smoke or have quit within the past 15 years. Fecal occult blood test (FOBT) of the stool. You may have this test every year starting at age 65. Flexible sigmoidoscopy or colonoscopy. You may have a sigmoidoscopy every 5 years or a colonoscopy every 10 years starting at age 17. Hepatitis C blood test. Hepatitis B blood test. Sexually transmitted disease (STD) testing. Diabetes screening. This is done by checking your blood sugar (glucose) after you have not eaten for a while (fasting). You may have this done every 1-3 years. Bone density scan. This is done to screen for osteoporosis. You may have this done starting at age 89. Mammogram. This may be done every 1-2 years. Talk to your health care provider about how often you should have regular mammograms. Talk with your health care provider about your test results, treatment options, and if necessary, the need for more tests. Vaccines  Your health care provider may recommend certain vaccines, such as: Influenza vaccine. This is recommended every year. Tetanus, diphtheria, and acellular pertussis (Tdap, Td) vaccine.  You may need a Td  booster every 10 years. Zoster vaccine. You may need this after age 77. Pneumococcal 13-valent conjugate (PCV13) vaccine. One dose is recommended after age 8. Pneumococcal polysaccharide (PPSV23) vaccine. One dose is recommended after age 50. Talk to your health care provider about which screenings and vaccines you need and how often you need them. This information is not intended to replace advice given to you by your health care provider. Make sure you discuss any questions you have with your health care provider. Document Released: 07/27/2015 Document Revised: 03/19/2016 Document Reviewed: 05/01/2015 Elsevier Interactive Patient Education  2017 [employer] Prevention in the Home Falls can cause injuries. They can happen to people of all ages. There are many things you can do to make your home safe and to help prevent falls. What can I do on the outside of my home? Regularly fix the edges of walkways and driveways and fix any cracks. Remove anything that might make you trip as you walk through a door, such as a raised step or threshold. Trim any bushes or trees on the path to your home. Use bright outdoor lighting. Clear any walking paths of anything that might make someone trip, such as rocks or tools. Regularly check to see if handrails are loose or broken. Make sure that both sides of any steps have handrails. Any raised decks and porches should have guardrails on the edges. Have any leaves, snow, or ice cleared regularly. Use sand or salt on walking paths during winter. Clean up any spills in your garage right away. This includes oil or grease spills. What can I do in the bathroom? Use night lights. Install grab bars by the toilet and in the tub and shower. Do not use towel bars as grab bars. Use non-skid mats or decals in the tub or shower. If you need to sit down in the shower, use a plastic, non-slip stool. Keep the floor dry. Clean up any water that spills on the floor  as soon as it happens. Remove soap buildup in the tub or shower regularly. Attach bath mats securely with double-sided non-slip rug tape. Do not have throw rugs and other things on the floor that can make you trip. What can I do in the bedroom? Use night lights. Make sure that you have a light by your bed that is easy to reach. Do not use any sheets or blankets that are too big for your bed. They should not hang down onto the floor. Have a firm chair that has side arms. You can use this for support while you get dressed. Do not have throw rugs and other things on the floor that can make you trip. What can I do in the kitchen? Clean up any spills right away. Avoid walking on wet floors. Keep items that you use a lot in easy-to-reach places. If you need to reach something above you, use a strong step stool that has a grab bar. Keep electrical cords out of the way. Do not use floor polish or wax that makes floors slippery. If you must use wax, use non-skid floor wax. Do not have throw rugs and other things on the floor that can make you trip. What can I do with my stairs? Do not leave any items on the stairs. Make sure that there are handrails on both sides of the stairs and use them. Fix handrails that are broken or loose. Make sure that handrails are as long as  the stairways. Check any carpeting to make sure that it is firmly attached to the stairs. Fix any carpet that is loose or worn. Avoid having throw rugs at the top or bottom of the stairs. If you do have throw rugs, attach them to the floor with carpet tape. Make sure that you have a light switch at the top of the stairs and the bottom of the stairs. If you do not have them, ask someone to add them for you. What else can I do to help prevent falls? Wear shoes that: Do not have high heels. Have rubber bottoms. Are comfortable and fit you well. Are closed at the toe. Do not wear sandals. If you use a stepladder: Make sure that it is  fully opened. Do not climb a closed stepladder. Make sure that both sides of the stepladder are locked into place. Ask someone to hold it for you, if possible. Clearly mark and make sure that you can see: Any grab bars or handrails. First and last steps. Where the edge of each step is. Use tools that help you move around (mobility aids) if they are needed. These include: Canes. Walkers. Scooters. Crutches. Turn on the lights when you go into a dark area. Replace any light bulbs as soon as they burn out. Set up your furniture so you have a clear path. Avoid moving your furniture around. If any of your floors are uneven, fix them. If there are any pets around you, be aware of where they are. Review your medicines with your doctor. Some medicines can make you feel dizzy. This can increase your chance of falling. Ask your doctor what other things that you can do to help prevent falls. This information is not intended to replace advice given to you by your health care provider. Make sure you discuss any questions you have with your health care provider. Document Released: 04/26/2009 Document Revised: 12/06/2015 Document Reviewed: 08/04/2014 Elsevier Interactive Patient Education  2017 [employer].

## 2022-01-24 NOTE — Progress Notes (Signed)
I connected with Katrina Simmons today by telephone and verified that I am speaking with the correct person using two identifiers. Location patient: home Location provider: work Persons participating in the virtual visit: patient, provider.   I discussed the limitations, risks, security and privacy concerns of performing an evaluation and management service by telephone and the availability of in person appointments. I also discussed with the patient that there may be a patient responsible charge related to this service. The patient expressed understanding and verbally consented to this telephonic visit.    Interactive audio and video telecommunications were attempted between this provider and patient, however failed, due to patient having technical difficulties OR patient did not have access to video capability.  We continued and completed visit with audio only.  Some vital signs may be absent or patient reported.   Time Spent with patient on telephone encounter: 30 minutes  Subjective:   Katrina Simmons is a 81 y.o. female who presents for Medicare Annual (Subsequent) preventive examination.  Review of Systems     Cardiac Risk Factors include: advanced age (>29men, >54 women);diabetes mellitus;hypertension;dyslipidemia;family history of premature cardiovascular disease;obesity (BMI >30kg/m2);sedentary lifestyle     Objective:    Today's Vitals   01/24/22 0904  PainSc: 10-Worst pain ever   There is no height or weight on file to calculate BMI.     01/24/2022    9:08 AM 01/25/2021    9:53 AM  Advanced Directives  Does Patient Have a Medical Advance Directive? Yes No  Type of Advance Directive Living will;Healthcare Power of Attorney   Does patient want to make changes to medical advance directive? No - Patient declined   Copy of Healthcare Power of Attorney in Chart? No - copy requested   Would patient like information on creating a medical advance directive?  No - Patient  declined    Current Medications (verified) Outpatient Encounter Medications as of 01/24/2022  Medication Sig   amLODipine (NORVASC) 5 MG tablet Take 1 tablet by mouth once daily   aspirin 81 MG tablet Take 81 mg by mouth daily.   celecoxib (CELEBREX) 100 MG capsule Take 1 capsule (100 mg total) by mouth 2 (two) times daily.   Cholecalciferol 125 MCG (5000 UT) capsule Take 5,000 Units by mouth daily.   diclofenac sodium (VOLTAREN) 1 % GEL Apply 2 g topically 3 (three) times daily as needed.   fluticasone (FLONASE) 50 MCG/ACT nasal spray Place 2 sprays into both nostrils daily.   glipiZIDE (GLUCOTROL XL) 5 MG 24 hr tablet Take 1 tablet by mouth once daily with breakfast   hydrocortisone (PROCTOSOL HC) 2.5 % rectal cream Place 1 application rectally 2 (two) times daily.   Menthol, Topical Analgesic, 2.5 % GEL Apply topically.   simvastatin (ZOCOR) 20 MG tablet TAKE 1 TABLET BY MOUTH ONCE DAILY 6PM   montelukast (SINGULAIR) 10 MG tablet Take 1 tablet (10 mg total) by mouth at bedtime. (Patient not taking: Reported on 01/24/2022)   oxybutynin (DITROPAN) 5 MG tablet Take 1 tablet (5 mg total) by mouth 2 (two) times daily as needed for bladder spasms. (Patient not taking: Reported on 01/24/2022)   No facility-administered encounter medications on file as of 01/24/2022.    Allergies (verified) Patient has no known allergies.   History: Past Medical History:  Diagnosis Date   Allergy    Arthritis    Diabetes mellitus without complication (HCC)    Diverticulosis    GERD (gastroesophageal reflux disease)    Hiatal hernia  History of colon polyps    Hypercholesteremia    Hyperlipidemia    Hypertension    Hyperthyroidism    Internal hemorrhoids    Lesion of right lobe of liver    Pancreatic cyst    Urinary incontinence    Past Surgical History:  Procedure Laterality Date   ABDOMINAL HYSTERECTOMY     Family History  Problem Relation Age of Onset   Stroke Mother    Hypertension  Mother    Diabetes Mother    Cancer Father        lung and prostate   Social History   Socioeconomic History   Marital status: Divorced    Spouse name: Not on file   Number of children: Not on file   Years of education: Not on file   Highest education level: Not on file  Occupational History   Not on file  Tobacco Use   Smoking status: Former   Smokeless tobacco: Never  Substance and Sexual Activity   Alcohol use: No    Alcohol/week: 0.0 standard drinks of alcohol   Drug use: No   Sexual activity: Not on file  Other Topics Concern   Not on file  Social History Narrative   Not on file   Social Determinants of Health   Financial Resource Strain: Low Risk  (01/24/2022)   Overall Financial Resource Strain (CARDIA)    Difficulty of Paying Living Expenses: Not hard at all  Food Insecurity: No Food Insecurity (01/24/2022)   Hunger Vital Sign    Worried About Running Out of Food in the Last Year: Never true    Ran Out of Food in the Last Year: Never true  Transportation Needs: No Transportation Needs (01/24/2022)   PRAPARE - Administrator, Civil ServiceTransportation    Lack of Transportation (Medical): No    Lack of Transportation (Non-Medical): No  Physical Activity: Inactive (01/24/2022)   Exercise Vital Sign    Days of Exercise per Week: 0 days    Minutes of Exercise per Session: 0 min  Stress: No Stress Concern Present (01/24/2022)   [employer] - Occupational Stress Questionnaire    Feeling of Stress : Not at all  Social Connections: Socially Isolated (01/24/2022)   Social Connection and Isolation Panel [NHANES]    Frequency of Communication with Friends and Family: More than three times a week    Frequency of Social Gatherings with Friends and Family: Once a week    Attends Religious Services: Never    Database administratorActive Member of Clubs or Organizations: No    Attends Engineer, structuralClub or Organization Meetings: Never    Marital Status: Divorced    Tobacco Counseling Counseling given: Not  Answered   Clinical Intake:  Pre-visit preparation completed: Yes  Pain : 0-10 Pain Score: 10-Worst pain ever Pain Type: Chronic pain Pain Location: Knee Pain Orientation: Left, Right Pain Descriptors / Indicators: Discomfort Pain Onset: More than a month ago Pain Frequency: Intermittent Pain Relieving Factors: Tramadol, Tylenol Arthritis, Voltaren Gel Effect of Pain on Daily Activities: Pain can diminish job performance, lower motivation to exercise, and prevent you from completing daily tasks. Pain produces disability and affects the quality of life.  Pain Relieving Factors: Tramadol, Tylenol Arthritis, Voltaren Gel  BMI - recorded: 30.4 Nutritional Status: BMI > 30  Obese Nutritional Risks: None Diabetes: Yes CBG done?: No Did pt. bring in CBG monitor from home?: No  How often do you need to have someone help you when you read instructions, pamphlets, or other  written materials from your doctor or pharmacy?: 1 - Never What is the last grade level you completed in school?: HSG; 2 years of college  Diabetic? yes  Interpreter Needed?: No  Information entered by :: Susie Cassette, LPN.   Activities of Daily Living    01/24/2022    9:20 AM 01/25/2021   10:01 AM  In your present state of health, do you have any difficulty performing the following activities:  Hearing? 1 0  Comment Patient wears hearing aids. wears hearing aids  Vision? 0 0  Comment Patient wears eyeglasses.   Difficulty concentrating or making decisions? 0 0  Walking or climbing stairs? 0 0  Dressing or bathing? 0 0  Doing errands, shopping? 0 0  Comment Patient still drives.   Preparing Food and eating ? N N  Using the Toilet? N N  In the past six months, have you accidently leaked urine? Malvin Johns  Comment Patient wears protection during day and night. wears daily protection  Do you have problems with loss of bowel control? N Y  Comment  wears daily protection  Managing your Medications? N N   Managing your Finances? N N  Housekeeping or managing your Housekeeping? N N    Patient Care Team: Myrlene Broker, MD as PCP - General (Internal Medicine) Marzella Simmons., MD as Consulting Physician (Ophthalmology)  Indicate any recent Medical Services you may have received from other than [facility] providers in the past year (date may be approximate).     Assessment:   This is a routine wellness examination for Katrina Simmons.  Hearing/Vision screen Hearing Screening - Comments:: Patient has hearing difficulty and wears hearing aids. Vision Screening - Comments:: Patient does wear corrective lenses/contacts.  [facility] exam done by: [facility]   Dietary issues and exercise activities discussed: Current Exercise Habits: The patient does not participate in regular exercise at present, Exercise limited by: orthopedic condition(s)   Goals Addressed             This Visit's Progress    Patient declined health goal at this time.        Depression Screen    01/24/2022    9:19 AM 01/25/2021   10:01 AM 06/11/2020    3:58 PM 03/08/2019    3:49 PM 04/27/2017    3:54 PM 11/02/2015    8:05 AM  PHQ 2/9 Scores  PHQ - 2 Score 0 0 0 0 1 0    Fall Risk    01/24/2022    9:08 AM 01/25/2021    9:59 AM 06/11/2020    3:57 PM 03/12/2020    1:11 PM 03/08/2019    3:49 PM  Fall Risk   Falls in the past year? 0 0 0 0 0  Number falls in past yr: 0 0 0 0   Injury with Fall? 0 0 0 0   Risk for fall due to : History of fall(s);Orthopedic patient No Fall Risks Impaired mobility No Fall Risks   Follow up Falls prevention discussed Falls evaluation completed Falls evaluation completed Falls evaluation completed     FALL RISK PREVENTION PERTAINING TO THE HOME:  Any stairs in or around the home? No  If so, are there any without handrails? No  Home free of loose throw rugs in walkways, pet beds, electrical cords, etc? Yes  Adequate lighting in your home to reduce risk of falls? Yes   ASSISTIVE  DEVICES UTILIZED TO PREVENT FALLS:  Life alert? No  Use  of a cane, walker or w/c? No  Grab bars in the bathroom? Yes  Shower chair or bench in shower? No  Elevated toilet seat or a handicapped toilet? Yes   TIMED UP AND GO:  Was the test performed? No .  Length of time to ambulate 10 feet: n/a sec.   Appearance of gait: Gait not evaluated during this visit.  Cognitive Function:        01/24/2022    9:22 AM  6CIT Screen  What Year? 0 points  What month? 0 points  What time? 0 points  Count back from 20 0 points  Months in reverse 0 points  Repeat phrase 0 points  Total Score 0 points    Immunizations Immunization History  Administered Date(s) Administered   PFIZER(Purple Top)SARS-COV-2 Vaccination 10/08/2019, 07/19/2020, 10/28/2020   Pneumococcal Conjugate-13 09/12/2014    TDAP status: Declined, Education has been provided regarding the importance of this vaccine but patient still declined. Advised may receive this vaccine at local pharmacy or Health Dept. Aware to provide a copy of the vaccination record if obtained from local pharmacy or Health Dept. Verbalized acceptance and understanding.  Flu Vaccine status: Declined, Education has been provided regarding the importance of this vaccine but patient still declined. Advised may receive this vaccine at local pharmacy or Health Dept. Aware to provide a copy of the vaccination record if obtained from local pharmacy or Health Dept. Verbalized acceptance and understanding.  Pneumococcal vaccine status: Due, Education has been provided regarding the importance of this vaccine. Advised may receive this vaccine at local pharmacy or Health Dept. Aware to provide a copy of the vaccination record if obtained from local pharmacy or Health Dept. Verbalized acceptance and understanding.  Covid-19 vaccine status: Completed vaccines  Qualifies for Shingles Vaccine? Yes   Zostavax completed No   Shingrix Completed?: No.    Education  has been provided regarding the importance of this vaccine. Patient has been advised to call insurance company to determine out of pocket expense if they have not yet received this vaccine. Advised may also receive vaccine at local pharmacy or Health Dept. Verbalized acceptance and understanding.  Screening Tests Health Maintenance  Topic Date Due   TETANUS/TDAP  Never done   Zoster Vaccines- Shingrix (1 of 2) Never done   DEXA SCAN  Never done   Pneumonia Vaccine 1+ Years old (2 - PPSV23 or PCV20) 09/12/2015   OPHTHALMOLOGY EXAM  09/12/2016   COVID-19 Vaccine (4 - Pfizer series) 12/23/2020   INFLUENZA VACCINE  02/11/2022   Diabetic kidney evaluation - GFR measurement  06/24/2022   Diabetic kidney evaluation - Urine ACR  06/24/2022   FOOT EXAM  06/24/2022   HPV VACCINES  Aged Out    Health Maintenance  Health Maintenance Due  Topic Date Due   TETANUS/TDAP  Never done   Zoster Vaccines- Shingrix (1 of 2) Never done   DEXA SCAN  Never done   Pneumonia Vaccine 59+ Years old (2 - PPSV23 or PCV20) 09/12/2015   OPHTHALMOLOGY EXAM  09/12/2016   COVID-19 Vaccine (4 - Pfizer series) 12/23/2020    Colorectal cancer screening: No longer required.   Mammogram status: Completed 05/18/2020. Repeat every year (Patient has to schedule with daughter)  Bone Density status: never done  Lung Cancer Screening: (Low Dose CT Chest recommended if Age 42-80 years, 30 pack-year currently smoking OR have quit w/in 15years.) does not qualify.   Lung Cancer Screening Referral: no  Additional Screening:  Hepatitis C Screening:  does not qualify; Completed no  Vision Screening: Recommended annual ophthalmology exams for early detection of glaucoma and other disorders of the eye. Is the patient up to date with their annual eye exam?  Yes  Who is the provider or what is the name of the office in which the patient attends annual eye exams? [facility] If pt is not established with a provider, would  they like to be referred to a provider to establish care? No .   Dental Screening: Recommended annual dental exams for proper oral hygiene  Community Resource Referral / Chronic Care Management: CRR required this visit?  No   CCM required this visit?  No      Plan:     I have personally reviewed and noted the following in the patient's chart:   Medical and social history Use of alcohol, tobacco or illicit drugs  Current medications and supplements including opioid prescriptions.  Functional ability and status Nutritional status Physical activity Advanced directives List of other physicians Hospitalizations, surgeries, and ER visits in previous 12 months Vitals Screenings to include cognitive, depression, and falls Referrals and appointments  In addition, I have reviewed and discussed with patient certain preventive protocols, quality metrics, and best practice recommendations. A written personalized care plan for preventive services as well as general preventive health recommendations were provided to patient.     Mickeal Needy, LPN   [id number]   Nurse Notes:  Patient is cogitatively intact. There were no vitals filed for this visit. There is no height or weight on file to calculate BMI. Patient stated that she has no issues with gait or balance; does not use any assistive devices. Medications reviewed with patient; no opioid use noted.

## 2022-03-11 ENCOUNTER — Other Ambulatory Visit: Payer: Self-pay | Admitting: Internal Medicine

## 2022-03-30 ENCOUNTER — Other Ambulatory Visit: Payer: Self-pay | Admitting: Internal Medicine

## 2022-03-31 ENCOUNTER — Telehealth: Payer: Self-pay | Admitting: Internal Medicine

## 2022-03-31 NOTE — Telephone Encounter (Signed)
PT calls today in need of a refill on two of their medications. They are needing a refill on their amLODipine (NORVASC) 5 mg, as well as their glipiZIDE (GLUCOTROL XL) 5 mg. They are currently on the last tablet for both. They are requesting these be sent to the pharmacy on file.  CB if needed: [phone]

## 2022-04-01 ENCOUNTER — Other Ambulatory Visit: Payer: Self-pay | Admitting: *Deleted

## 2022-04-01 MED ORDER — GLIPIZIDE ER 5 MG PO TB24: 5.0000 mg | ORAL_TABLET | Freq: Every day | ORAL | 0 refills | Status: DC

## 2022-04-01 MED ORDER — AMLODIPINE BESYLATE 5 MG PO TABS: 5.0000 mg | ORAL_TABLET | Freq: Every day | ORAL | 0 refills | Status: DC

## 2022-04-01 NOTE — Telephone Encounter (Signed)
PT calls back again regarding these scripts. I had let her know it looked like we hadn't gotten to it yet, but that she did call at the last bit of the day as well.

## 2022-04-01 NOTE — Telephone Encounter (Signed)
Sent!

## 2022-06-03 ENCOUNTER — Other Ambulatory Visit: Payer: Self-pay | Admitting: Internal Medicine

## 2022-06-03 NOTE — Telephone Encounter (Signed)
Can have 1 90 day fill until visit, needs to schedule visit within that time

## 2022-06-10 NOTE — Telephone Encounter (Signed)
Spoke with Katrina Simmons and she has to wait on her daughter to see about her availability because her daughter is her transportation. She stated she will give me a call back this week.

## 2022-06-23 ENCOUNTER — Telehealth: Payer: Self-pay | Admitting: Internal Medicine

## 2022-06-23 NOTE — Telephone Encounter (Signed)
Caller & Relationship to patient:Self   Call back number:[phone]   Date of last office visit:   Date of next office visit:  07/01/2022   Medication(s) to be refilled:glipizide, amlodopine,         Preferred Pharmacy:  [facility].

## 2022-06-24 ENCOUNTER — Other Ambulatory Visit: Payer: Self-pay

## 2022-06-24 MED ORDER — GLIPIZIDE ER 5 MG PO TB24: 5.0000 mg | ORAL_TABLET | Freq: Every day | ORAL | 0 refills | Status: DC

## 2022-06-24 MED ORDER — AMLODIPINE BESYLATE 5 MG PO TABS: 5.0000 mg | ORAL_TABLET | Freq: Every day | ORAL | 0 refills | Status: DC

## 2022-06-24 NOTE — Telephone Encounter (Signed)
Refill sent.

## 2022-06-24 NOTE — Telephone Encounter (Signed)
Ok to fill 90 day no refill she has upcoming apt

## 2022-07-01 ENCOUNTER — Ambulatory Visit (INDEPENDENT_AMBULATORY_CARE_PROVIDER_SITE_OTHER): Payer: Medicare Other | Admitting: Internal Medicine

## 2022-07-01 ENCOUNTER — Encounter: Payer: Self-pay | Admitting: Internal Medicine

## 2022-07-01 VITALS — BP 140/70 | HR 109 | Temp 97.9°F | Ht 62.0 in | Wt 162.2 lb

## 2022-07-01 DIAGNOSIS — E1169 Type 2 diabetes mellitus with other specified complication: Secondary | ICD-10-CM | POA: Diagnosis not present

## 2022-07-01 DIAGNOSIS — I10 Essential (primary) hypertension: Secondary | ICD-10-CM | POA: Diagnosis not present

## 2022-07-01 DIAGNOSIS — M25561 Pain in right knee: Secondary | ICD-10-CM

## 2022-07-01 DIAGNOSIS — J0110 Acute frontal sinusitis, unspecified: Secondary | ICD-10-CM

## 2022-07-01 DIAGNOSIS — E118 Type 2 diabetes mellitus with unspecified complications: Secondary | ICD-10-CM

## 2022-07-01 DIAGNOSIS — N393 Stress incontinence (female) (male): Secondary | ICD-10-CM

## 2022-07-01 DIAGNOSIS — E785 Hyperlipidemia, unspecified: Secondary | ICD-10-CM

## 2022-07-01 DIAGNOSIS — Z0000 Encounter for general adult medical examination without abnormal findings: Secondary | ICD-10-CM

## 2022-07-01 DIAGNOSIS — M25562 Pain in left knee: Secondary | ICD-10-CM

## 2022-07-01 DIAGNOSIS — G8929 Other chronic pain: Secondary | ICD-10-CM

## 2022-07-01 LAB — CBC
HCT: 39.8 % (ref 36.0–46.0)
Hemoglobin: 13.7 g/dL (ref 12.0–15.0)
MCHC: 34.4 g/dL (ref 30.0–36.0)
MCV: 87.6 fl (ref 78.0–100.0)
Platelets: 210 10*3/uL (ref 150.0–400.0)
RBC: 4.55 Mil/uL (ref 3.87–5.11)
RDW: 14.6 % (ref 11.5–15.5)
WBC: 7.4 10*3/uL (ref 4.0–10.5)

## 2022-07-01 LAB — COMPREHENSIVE METABOLIC PANEL
ALT: 14 U/L (ref 0–35)
AST: 19 U/L (ref 0–37)
Albumin: 4.4 g/dL (ref 3.5–5.2)
Alkaline Phosphatase: 89 U/L (ref 39–117)
BUN: 15 mg/dL (ref 6–23)
CO2: 26 mEq/L (ref 19–32)
Calcium: 9.7 mg/dL (ref 8.4–10.5)
Chloride: 101 mEq/L (ref 96–112)
Creatinine, Ser: 0.71 mg/dL (ref 0.40–1.20)
GFR: 78.62 mL/min (ref >60.00)
Glucose, Bld: 121 mg/dL — ABNORMAL HIGH (ref 70–99)
Potassium: 3.4 mEq/L — ABNORMAL LOW (ref 3.5–5.1)
Sodium: 138 mEq/L (ref 135–145)
Total Bilirubin: 0.6 mg/dL (ref 0.2–1.2)
Total Protein: 7.8 g/dL (ref 6.0–8.3)

## 2022-07-01 LAB — LIPID PANEL
Cholesterol: 166 mg/dL (ref 0–200)
HDL: 63.1 mg/dL (ref >39.00)
LDL Cholesterol: 70 mg/dL (ref 0–99)
NonHDL: 102.68
Total CHOL/HDL Ratio: 3
Triglycerides: 165 mg/dL — ABNORMAL HIGH (ref 0.0–149.0)
VLDL: 33 mg/dL (ref 0.0–40.0)

## 2022-07-01 LAB — MICROALBUMIN / CREATININE URINE RATIO
Creatinine,U: 64 mg/dL
Microalb Creat Ratio: 4.5 mg/g (ref 0.0–30.0)
Microalb, Ur: 2.9 mg/dL — ABNORMAL HIGH (ref 0.0–1.9)

## 2022-07-01 MED ORDER — AMLODIPINE BESYLATE 5 MG PO TABS: 5.0000 mg | ORAL_TABLET | Freq: Every day | ORAL | 3 refills | Status: DC

## 2022-07-01 MED ORDER — PREDNISONE 20 MG PO TABS: 40.0000 mg | ORAL_TABLET | Freq: Every day | ORAL | 0 refills | Status: DC

## 2022-07-01 MED ORDER — CELECOXIB 100 MG PO CAPS: 100.0000 mg | ORAL_CAPSULE | Freq: Two times a day (BID) | ORAL | 1 refills | Status: DC

## 2022-07-01 MED ORDER — OXYBUTYNIN CHLORIDE 5 MG PO TABS: 5.0000 mg | ORAL_TABLET | Freq: Every day | ORAL | 1 refills | Status: DC | PRN

## 2022-07-01 MED ORDER — AMOXICILLIN-POT CLAVULANATE 875-125 MG PO TABS: 1.0000 | ORAL_TABLET | Freq: Two times a day (BID) | ORAL | 0 refills | Status: AC

## 2022-07-01 MED ORDER — SIMVASTATIN 20 MG PO TABS: ORAL_TABLET | ORAL | 3 refills | Status: DC

## 2022-07-01 MED ORDER — FLUTICASONE PROPIONATE 50 MCG/ACT NA SUSP: 2.0000 | Freq: Every day | NASAL | 3 refills | Status: AC

## 2022-07-01 NOTE — Patient Instructions (Signed)
We have sent in the prednisone to take 2 pills daily for 5 days.  We have sent in augmentin to take 1 pill twice a day for 1 week.

## 2022-07-01 NOTE — Progress Notes (Unsigned)
   Subjective:   Patient ID: Katrina Simmons, female    DOB: [birth year], 81 y.o.   MRN: [id number]  HPI The patient is here for physical. With acute concerns.  PMH, [facility], social history reviewed and updated  Review of Systems  Objective:  Physical Exam  Vitals:   07/01/22 1503 07/01/22 1512  BP: (!) 140/70 (!) 140/70  Pulse: (!) 109   Temp: 97.9 F (36.6 C)   TempSrc: Oral   SpO2: 94%   Weight: 162 lb 4 oz (73.6 kg)   Height: 5\' 2"  (1.575 m)     Assessment & Plan:

## 2022-07-02 DIAGNOSIS — J019 Acute sinusitis, unspecified: Secondary | ICD-10-CM | POA: Insufficient documentation

## 2022-07-02 LAB — HEMOGLOBIN A1C: Hgb A1c MFr Bld: 6.5 % (ref 4.6–6.5)

## 2022-07-02 NOTE — Assessment & Plan Note (Signed)
Had not been using oxybutynin lately. Refilled so she will have access to this. She is using only 5 mg daily prn.

## 2022-07-02 NOTE — Assessment & Plan Note (Signed)
BP at goal on amlodipine 5 mg daily and checking CMP and adjust as needed.

## 2022-07-02 NOTE — Assessment & Plan Note (Signed)
Foot exam done. Checking HgA1c and microalbumin to creatinine ratio and lipid panel. Reminded about eye exam. Adjust glipizide 5 mg daily as needed. On statin.

## 2022-07-02 NOTE — Assessment & Plan Note (Signed)
She has severe OA bilaterally and has done gel and steroid injections and no significant relief. She does not want to get surgery. Rx prednisone 5 day course to see if she can get some relief. If so would be comfortable doing this every 2-3 months as needed. She is taking celebrex rarely and refilled (last filled 6 month supply about 1 year ago).

## 2022-07-02 NOTE — Assessment & Plan Note (Signed)
Rx 1 week augmentin for likely sinus infection. Symptoms ongoing 2-3 weeks and overall worsening. She is taking otc antihistamine and encouraged to continue.

## 2022-07-02 NOTE — Assessment & Plan Note (Signed)
Flu shot declines. Covid-19 counseled. Pneumonia declines. Shingrix declines. Tetanus declines. Colonoscopy aged out. Mammogram aged out, pap smear aged out and dexa declines. Counseled about sun safety and mole surveillance. Counseled about the dangers of distracted driving. Given 10 year screening recommendations.

## 2022-07-02 NOTE — Assessment & Plan Note (Signed)
Checking lipid panel and adjust simvastatin 20 mg daily as needed.  

## 2022-09-16 ENCOUNTER — Other Ambulatory Visit: Payer: Self-pay | Admitting: Internal Medicine

## 2022-10-21 ENCOUNTER — Ambulatory Visit (INDEPENDENT_AMBULATORY_CARE_PROVIDER_SITE_OTHER): Payer: Medicare Other | Admitting: Internal Medicine

## 2022-10-21 ENCOUNTER — Encounter: Payer: Self-pay | Admitting: Internal Medicine

## 2022-10-21 VITALS — BP 160/80 | HR 122 | Temp 98.6°F | Ht 62.0 in | Wt 163.0 lb

## 2022-10-21 DIAGNOSIS — M7989 Other specified soft tissue disorders: Secondary | ICD-10-CM

## 2022-10-21 DIAGNOSIS — I872 Venous insufficiency (chronic) (peripheral): Secondary | ICD-10-CM | POA: Insufficient documentation

## 2022-10-21 MED ORDER — FUROSEMIDE 20 MG PO TABS: 20.0000 mg | ORAL_TABLET | Freq: Every day | ORAL | 3 refills | Status: DC

## 2022-10-21 NOTE — Assessment & Plan Note (Addendum)
Significant new swelling without obvious change in diet. EKG done and no changes. Checking CBC, CMP, BNP to rule out metabolic etiology. Rx lasix 20 mg daily for 1 week and change based on labs.

## 2022-10-21 NOTE — Patient Instructions (Signed)
We have checked the EKG today and this is normal. We will check the labs today.  We have sent in a fluid pill lasix (furosemide) to take 1 pill daily for 1 week to see if this helps. We may change this plan if the labs show anything.

## 2022-10-21 NOTE — Progress Notes (Signed)
   Subjective:   Patient ID: Katrina Simmons, female    DOB: [birth year], 82 y.o.   MRN: [id number]  HPI The patient is an 82 YO female comnig in for new swelling in legs to knees bilaterally. No change in diet or movement. Denies chest pains or SOB. In morning is down and throughout day swelling increases. Some pain associated.   Review of Systems  Constitutional: Negative.   HENT: Negative.    Eyes: Negative.   Respiratory:  Negative for cough, chest tightness and shortness of breath.   Cardiovascular:  Positive for leg swelling. Negative for chest pain and palpitations.  Gastrointestinal:  Negative for abdominal distention, abdominal pain, constipation, diarrhea, nausea and vomiting.  Musculoskeletal:  Positive for arthralgias and gait problem.  Skin: Negative.   Psychiatric/Behavioral: Negative.      Objective:  Physical Exam Constitutional:      Appearance: She is well-developed.  HENT:     Head: Normocephalic and atraumatic.  Cardiovascular:     Rate and Rhythm: Normal rate and regular rhythm.  Pulmonary:     Effort: Pulmonary effort is normal. No respiratory distress.     Breath sounds: Normal breath sounds. No wheezing or rales.  Abdominal:     General: Bowel sounds are normal. There is no distension.     Palpations: Abdomen is soft.     Tenderness: There is no abdominal tenderness. There is no rebound.  Musculoskeletal:        General: Tenderness present.     Cervical back: Normal range of motion.     Right lower leg: Edema present.     Left lower leg: Edema present.     Comments: 2+ pitting edema bilaterally to the knees  Skin:    General: Skin is warm and dry.  Neurological:     Mental Status: She is alert and oriented to person, place, and time.     Coordination: Coordination normal.     Vitals:   10/21/22 1538 10/21/22 1542  BP: (!) 160/80 (!) 160/80  Pulse: (!) 122   Temp: 98.6 F (37 C)   TempSrc: Oral   SpO2: 98%   Weight: 163 lb (73.9 kg)    Height: 5\' 2"  (1.575 m)   EKG: Rate 105, axis normal, interval normal, sinus tachy, no st or t wave changes V2 and III would not capture due to lotion, no prior to compare   Assessment & Plan:

## 2022-10-22 LAB — CBC
HCT: 42 % (ref 36.0–46.0)
Hemoglobin: 14.4 g/dL (ref 12.0–15.0)
MCHC: 34.4 g/dL (ref 30.0–36.0)
MCV: 87.8 fl (ref 78.0–100.0)
Platelets: 235 10*3/uL (ref 150.0–400.0)
RBC: 4.78 Mil/uL (ref 3.87–5.11)
RDW: 13.9 % (ref 11.5–15.5)
WBC: 6.6 10*3/uL (ref 4.0–10.5)

## 2022-10-22 LAB — COMPREHENSIVE METABOLIC PANEL
ALT: 10 U/L (ref 0–35)
AST: 18 U/L (ref 0–37)
Albumin: 4.8 g/dL (ref 3.5–5.2)
Alkaline Phosphatase: 92 U/L (ref 39–117)
BUN: 16 mg/dL (ref 6–23)
CO2: 26 mEq/L (ref 19–32)
Calcium: 10.2 mg/dL (ref 8.4–10.5)
Chloride: 101 mEq/L (ref 96–112)
Creatinine, Ser: 0.82 mg/dL (ref 0.40–1.20)
GFR: 66 mL/min (ref >60.00)
Glucose, Bld: 140 mg/dL — ABNORMAL HIGH (ref 70–99)
Potassium: 3.4 mEq/L — ABNORMAL LOW (ref 3.5–5.1)
Sodium: 140 mEq/L (ref 135–145)
Total Bilirubin: 0.6 mg/dL (ref 0.2–1.2)
Total Protein: 8.3 g/dL (ref 6.0–8.3)

## 2022-10-22 LAB — BRAIN NATRIURETIC PEPTIDE: Pro B Natriuretic peptide (BNP): 12 pg/mL (ref 0.0–100.0)

## 2023-01-27 ENCOUNTER — Ambulatory Visit: Payer: Medicare Other

## 2023-01-27 VITALS — Ht 62.0 in | Wt 160.0 lb

## 2023-01-27 DIAGNOSIS — Z0000 Encounter for general adult medical examination without abnormal findings: Secondary | ICD-10-CM | POA: Diagnosis not present

## 2023-01-27 DIAGNOSIS — Z135 Encounter for screening for eye and ear disorders: Secondary | ICD-10-CM

## 2023-01-27 DIAGNOSIS — Z5941 Food insecurity: Secondary | ICD-10-CM | POA: Diagnosis not present

## 2023-01-27 NOTE — Progress Notes (Addendum)
Subjective:   Katrina Simmons is a 82 y.o. female who presents for Medicare Annual (Subsequent) preventive examination.  Visit Complete: Virtual  I connected with  Katrina Simmons on 01/27/23 by a audio enabled telemedicine application and verified that I am speaking with the correct person using two identifiers.  Patient Location: Home  Provider Location: Office/Clinic  I discussed the limitations of evaluation and management by telemedicine. The patient expressed understanding and agreed to proceed.  Ms. [name] , Thank you for taking time to come for your Medicare Wellness Visit. I appreciate your ongoing commitment to your health goals. Please review the following plan we discussed and let me know if I can assist you in the future.   These are the goals we discussed:  Goals      My goal for 2024 is to maintain my health.        This is a list of the screening recommended for you and due dates:  Health Maintenance  Topic Date Due   DTaP/Tdap/Td vaccine (1 - Tdap) Never done   Zoster (Shingles) Vaccine (1 of 2) Never done   DEXA scan (bone density measurement)  Never done   Pneumonia Vaccine (2 of 2 - PPSV23 or PCV20) 09/12/2015   Eye exam for diabetics  09/12/2016   COVID-19 Vaccine (4 - 2023-24 season) 03/14/2022   Flu Shot  02/12/2023   Yearly kidney health urinalysis for diabetes  07/02/2023   Complete foot exam   07/02/2023   Yearly kidney function blood test for diabetes  10/21/2023   Medicare Annual Wellness Visit  01/27/2024   HPV Vaccine  Aged Out    Review of Systems     Cardiac Risk Factors include: advanced age (>87men, >37 women);diabetes mellitus;dyslipidemia;family history of premature cardiovascular disease;hypertension;sedentary lifestyle     Objective:    Today's Vitals   01/27/23 1504  Weight: 160 lb (72.6 kg)  Height: 5\' 2"  (1.575 m)  PainSc: 8   PainLoc: Leg   Body mass index is 29.26 kg/m.     01/27/2023    3:07 PM 01/24/2022     9:08 AM 01/25/2021    9:53 AM  Advanced Directives  Does Patient Have a Medical Advance Directive? Yes Yes No  Type of Estate agent of [place];Living will Living will;Healthcare Power of Attorney   Does patient want to make changes to medical advance directive?  No - Patient declined   Copy of Healthcare Power of Attorney in Chart? No - copy requested No - copy requested   Would patient like information on creating a medical advance directive?   No - Patient declined    Current Medications (verified) Outpatient Encounter Medications as of 01/27/2023  Medication Sig   amLODipine (NORVASC) 5 MG tablet Take 1 tablet (5 mg total) by mouth daily.   aspirin 81 MG tablet Take 81 mg by mouth daily.   celecoxib (CELEBREX) 100 MG capsule Take 1 capsule (100 mg total) by mouth 2 (two) times daily.   fluticasone (FLONASE) 50 MCG/ACT nasal spray Place 2 sprays into both nostrils daily.   furosemide (LASIX) 20 MG tablet Take 1 tablet (20 mg total) by mouth daily.   glipiZIDE (GLUCOTROL XL) 5 MG 24 hr tablet Take 1 tablet by mouth once daily with breakfast   hydrocortisone (PROCTOSOL HC) 2.5 % rectal cream Place 1 application rectally 2 (two) times daily.   oxybutynin (DITROPAN) 5 MG tablet Take 1 tablet (5 mg total) by mouth daily as needed  for bladder spasms.   simvastatin (ZOCOR) 20 MG tablet TAKE 1 TABLET BY MOUTH IN THE EVENING AT 6 PM Annual appt due must see provider for future refills   No facility-administered encounter medications on file as of 01/27/2023.    Allergies (verified) Patient has no known allergies.   History: Past Medical History:  Diagnosis Date   Allergy    Arthritis    Diabetes mellitus without complication (HCC)    Diverticulosis    GERD (gastroesophageal reflux disease)    Hiatal hernia    History of colon polyps    Hypercholesteremia    Hyperlipidemia    Hypertension    Hyperthyroidism    Internal hemorrhoids    Lesion of right lobe of  liver    Pancreatic cyst    Urinary incontinence    Past Surgical History:  Procedure Laterality Date   ABDOMINAL HYSTERECTOMY     Family History  Problem Relation Age of Onset   Stroke Mother    Hypertension Mother    Diabetes Mother    Cancer Father        lung and prostate   Social History   Socioeconomic History   Marital status: Divorced    Spouse name: Not on file   Number of children: Not on file   Years of education: Not on file   Highest education level: Not on file  Occupational History   Not on file  Tobacco Use   Smoking status: Former   Smokeless tobacco: Never  Substance and Sexual Activity   Alcohol use: No    Alcohol/week: 0.0 standard drinks of alcohol   Drug use: No   Sexual activity: Not on file  Other Topics Concern   Not on file  Social History Narrative   Not on file   Social Determinants of Health   Financial Resource Strain: Low Risk  (01/27/2023)   Overall Financial Resource Strain (CARDIA)    Difficulty of Paying Living Expenses: Not hard at all  Food Insecurity: Food Insecurity Present (01/27/2023)   Hunger Vital Sign    Worried About Running Out of Food in the Last Year: Often true    Ran Out of Food in the Last Year: Often true  Transportation Needs: No Transportation Needs (01/27/2023)   PRAPARE - Administrator, Civil Service (Medical): No    Lack of Transportation (Non-Medical): No  Physical Activity: Patient Declined (01/27/2023)   Exercise Vital Sign    Days of Exercise per Week: Patient declined    Minutes of Exercise per Session: Patient declined  Stress: No Stress Concern Present (01/27/2023)   [employer] of Occupational Health - Occupational Stress Questionnaire    Feeling of Stress : Not at all  Social Connections: Socially Isolated (01/27/2023)   Social Connection and Isolation Panel [NHANES]    Frequency of Communication with Friends and Family: More than three times a week    Frequency of Social  Gatherings with Friends and Family: Once a week    Attends Religious Services: Never    Database administrator or Organizations: No    Attends Engineer, structural: Never    Marital Status: Divorced    Tobacco Counseling Counseling given: Not Answered   Clinical Intake:  Pre-visit preparation completed: Yes  Pain : No/denies pain Pain Score: 8      BMI - recorded: 29.26 Nutritional Status: BMI 25 -29 Overweight Nutritional Risks: None Diabetes: Yes CBG done?: No Did pt. bring in  CBG monitor from home?: No  How often do you need to have someone help you when you read instructions, pamphlets, or other written materials from your doctor or pharmacy?: 1 - Never What is the last grade level you completed in school?: 2 years of college  Interpreter Needed?: No  Information entered by :: Niya Behler N. Eliceo Gladu, LPN.   Activities of Daily Living    01/27/2023    3:13 PM  In your present state of health, do you have any difficulty performing the following activities:  Hearing? 1  Comment wears hearing aids  Vision? 0  Difficulty concentrating or making decisions? 0  Walking or climbing stairs? 1  Comment lower extremity pain  Dressing or bathing? 0  Doing errands, shopping? 0  Preparing Food and eating ? N  Using the Toilet? N  In the past six months, have you accidently leaked urine? Y  Comment wear pads  Do you have problems with loss of bowel control? N  Managing your Medications? N  Managing your Finances? N  Housekeeping or managing your Housekeeping? N    Patient Care Team: Myrlene Broker, MD as PCP - General (Internal Medicine) Marzella Simmons., MD as Consulting Physician (Ophthalmology)  Indicate any recent Medical Services you may have received from other than [facility] providers in the past year (date may be approximate).     Assessment:   This is a routine wellness examination for [place].  Hearing/Vision screen Hearing Screening -  Comments:: Patient wears hearing aids. Vision Screening - Comments:: Patient wears eyeglasses.  No current ophthalmologist.  Referral placed today to get eyes checked for diabetic retinopathy with Antony Contras, MD.  Dietary issues and exercise activities discussed:     Goals Addressed             This Visit's Progress    My goal for 2024 is to maintain my health.        Depression Screen    01/27/2023    3:12 PM 07/01/2022    3:17 PM 01/24/2022    9:19 AM 01/25/2021   10:01 AM 06/11/2020    3:58 PM 03/08/2019    3:49 PM 04/27/2017    3:54 PM  PHQ 2/9 Scores  PHQ - 2 Score 2 0 0 0 0 0 1  PHQ- 9 Score 4 0         Fall Risk    01/27/2023    3:09 PM 10/21/2022    3:41 PM 07/01/2022    3:16 PM 01/24/2022    9:08 AM 01/25/2021    9:59 AM  Fall Risk   Falls in the past year? 0 0 0 0 0  Number falls in past yr: 0 0 0 0 0  Injury with Fall? 0 0 0 0 0  Risk for fall due to : No Fall Risks   History of fall(s);Orthopedic patient No Fall Risks  Follow up Falls prevention discussed Falls evaluation completed Falls evaluation completed Falls prevention discussed Falls evaluation completed    MEDICARE RISK AT HOME:  Medicare Risk at Home - 01/27/23 1507     Any stairs in or around the home? No    If so, are there any without handrails? No    Home free of loose throw rugs in walkways, pet beds, electrical cords, etc? Yes    Adequate lighting in your home to reduce risk of falls? Yes    Life alert? No    Use of a cane, walker or  w/c? No    Grab bars in the bathroom? Yes    Shower chair or bench in shower? No    Elevated toilet seat or a handicapped toilet? No             TIMED UP AND GO:  Was the test performed?  No    Cognitive Function:        01/27/2023    3:10 PM 01/24/2022    9:22 AM  6CIT Screen  What Year? 0 points 0 points  What month? 0 points 0 points  What time? 0 points 0 points  Count back from 20 0 points 0 points  Months in reverse 0 points 0 points   Repeat phrase 0 points 0 points  Total Score 0 points 0 points    Immunizations Immunization History  Administered Date(s) Administered   PFIZER(Purple Top)SARS-COV-2 Vaccination 10/08/2019, 07/19/2020, 10/28/2020   Pneumococcal Conjugate-13 09/12/2014    TDAP status: Due, Education has been provided regarding the importance of this vaccine. Advised may receive this vaccine at local pharmacy or Health Dept. Aware to provide a copy of the vaccination record if obtained from local pharmacy or Health Dept. Verbalized acceptance and understanding.  Flu Vaccine status: Declined, Education has been provided regarding the importance of this vaccine but patient still declined. Advised may receive this vaccine at local pharmacy or Health Dept. Aware to provide a copy of the vaccination record if obtained from local pharmacy or Health Dept. Verbalized acceptance and understanding.  Pneumococcal vaccine status: Declined,  Education has been provided regarding the importance of this vaccine but patient still declined. Advised may receive this vaccine at local pharmacy or Health Dept. Aware to provide a copy of the vaccination record if obtained from local pharmacy or Health Dept. Verbalized acceptance and understanding.   Covid-19 vaccine status: Declined, Education has been provided regarding the importance of this vaccine but patient still declined. Advised may receive this vaccine at local pharmacy or Health Dept.or vaccine clinic. Aware to provide a copy of the vaccination record if obtained from local pharmacy or Health Dept. Verbalized acceptance and understanding.  Qualifies for Shingles Vaccine? Yes   Zostavax completed No   Shingrix Completed?: No.    Education has been provided regarding the importance of this vaccine. Patient has been advised to call insurance company to determine out of pocket expense if they have not yet received this vaccine. Advised may also receive vaccine at local  pharmacy or Health Dept. Verbalized acceptance and understanding.  Screening Tests Health Maintenance  Topic Date Due   DTaP/Tdap/Td (1 - Tdap) Never done   Zoster Vaccines- Shingrix (1 of 2) Never done   DEXA SCAN  Never done   Pneumonia Vaccine 78+ Years old (2 of 2 - PPSV23 or PCV20) 09/12/2015   OPHTHALMOLOGY EXAM  09/12/2016   COVID-19 Vaccine (4 - 2023-24 season) 03/14/2022   INFLUENZA VACCINE  02/12/2023   Diabetic kidney evaluation - Urine ACR  07/02/2023   FOOT EXAM  07/02/2023   Diabetic kidney evaluation - eGFR measurement  10/21/2023   Medicare Annual Wellness (AWV)  01/27/2024   HPV VACCINES  Aged Out    Health Maintenance  Health Maintenance Due  Topic Date Due   DTaP/Tdap/Td (1 - Tdap) Never done   Zoster Vaccines- Shingrix (1 of 2) Never done   DEXA SCAN  Never done   Pneumonia Vaccine 19+ Years old (2 of 2 - PPSV23 or PCV20) 09/12/2015   OPHTHALMOLOGY EXAM  09/12/2016  COVID-19 Vaccine (4 - 2023-24 season) 03/14/2022    Colorectal cancer screening: No longer required.   Mammogram status: No longer required due to age/patient declined.  Bone Density status: Never done  Lung Cancer Screening: (Low Dose CT Chest recommended if Age 46-80 years, 20 pack-year currently smoking OR have quit w/in 15years.) does not qualify.   Lung Cancer Screening Referral: no  Additional Screening:  Hepatitis C Screening: does not qualify; Completed: no  Vision Screening: Recommended annual ophthalmology exams for early detection of glaucoma and other disorders of the eye. Is the patient up to date with their annual eye exam?  No  Who is the provider or what is the name of the office in which the patient attends annual eye exams? Referral placed to Antony Contras, MD. If pt is not established with a provider, would they like to be referred to a provider to establish care? Yes .   Dental Screening: Recommended annual dental exams for proper oral hygiene  Diabetic Foot Exam:  Diabetic Foot Exam: Completed 07/01/2022  Community Resource Referral / Chronic Care Management: CRR required this visit?  Yes , referral to ophthalmologist.  CCM required this visit?  PCP informed of CCM need; Yes, due to severe food insecurity.     Plan:     I have personally reviewed and noted the following in the patient's chart:   Medical and social history Use of alcohol, tobacco or illicit drugs  Current medications and supplements including opioid prescriptions. Patient is not currently taking opioid prescriptions. Functional ability and status Nutritional status Physical activity Advanced directives List of other physicians Hospitalizations, surgeries, and ER visits in previous 12 months Vitals Screenings to include cognitive, depression, and falls Referrals and appointments  In addition, I have reviewed and discussed with patient certain preventive protocols, quality metrics, and best practice recommendations. A written personalized care plan for preventive services as well as general preventive health recommendations were provided to patient.     Mickeal Needy, LPN   10/20/8117   After Visit Summary: (Mail) Due to this being a telephonic visit, the after visit summary with patients personalized plan was offered to patient via mail   Nurse Notes: Normal cognitive status assessed by direct observation via telephone conversation by this Nurse Health Advisor. No abnormalities found.

## 2023-01-27 NOTE — Patient Instructions (Addendum)
Katrina Simmons , Thank you for taking time to come for your Medicare Wellness Visit. I appreciate your ongoing commitment to your health goals. Please review the following plan we discussed and let me know if I can assist you in the future.   These are the goals we discussed:  Goals      My goal for 2024 is to maintain my health.        This is a list of the screening recommended for you and due dates:  Health Maintenance  Topic Date Due   DTaP/Tdap/Td vaccine (1 - Tdap) Never done   Zoster (Shingles) Vaccine (1 of 2) Never done   DEXA scan (bone density measurement)  Never done   Pneumonia Vaccine (2 of 2 - PPSV23 or PCV20) 09/12/2015   Eye exam for diabetics  09/12/2016   COVID-19 Vaccine (4 - 2023-24 season) 03/14/2022   Flu Shot  02/12/2023   Yearly kidney health urinalysis for diabetes  07/02/2023   Complete foot exam   07/02/2023   Yearly kidney function blood test for diabetes  10/21/2023   Medicare Annual Wellness Visit  01/27/2024   HPV Vaccine  Aged Out    Advanced directives: Yes  Conditions/risks identified: Yes; Type II Diabetes  Next appointment: It was nice speaking with you today!  Please follow up in one year for your annual wellness visit via telephone call with Nurse Percell Miller on 01/28/2024 at :15 p.m.  If you need to cancel or reschedule please call [phone].   Preventive Care 82 Years and Older, Female Preventive care refers to lifestyle choices and visits with your health care provider that can promote health and wellness. What does preventive care include? A yearly physical exam. This is also called an annual well check. Dental exams once or twice a year. Routine eye exams. Ask your health care provider how often you should have your eyes checked. Personal lifestyle choices, including: Daily care of your teeth and gums. Regular physical activity. Eating a healthy diet. Avoiding tobacco and drug use. Limiting alcohol use. Practicing safe  sex. Taking low-dose aspirin every day. Taking vitamin and mineral supplements as recommended by your health care provider. What happens during an annual well check? The services and screenings done by your health care provider during your annual well check will depend on your age, overall health, lifestyle risk factors, and family history of disease. Counseling  Your health care provider may ask you questions about your: Alcohol use. Tobacco use. Drug use. Emotional well-being. Home and relationship well-being. Sexual activity. Eating habits. History of falls. Memory and ability to understand (cognition). Work and work Astronomer. Reproductive health. Screening  You may have the following tests or measurements: Height, weight, and BMI. Blood pressure. Lipid and cholesterol levels. These may be checked every 5 years, or more frequently if you are over 81 years old. Skin check. Lung cancer screening. You may have this screening every year starting at age 45 if you have a 30-pack-year history of smoking and currently smoke or have quit within the past 15 years. Fecal occult blood test (FOBT) of the stool. You may have this test every year starting at age 61. Flexible sigmoidoscopy or colonoscopy. You may have a sigmoidoscopy every 5 years or a colonoscopy every 10 years starting at age 68. Hepatitis C blood test. Hepatitis B blood test. Sexually transmitted disease (STD) testing. Diabetes screening. This is done by checking your blood sugar (glucose) after you have not eaten for a while (  fasting). You may have this done every 1-3 years. Bone density scan. This is done to screen for osteoporosis. You may have this done starting at age 35. Mammogram. This may be done every 1-2 years. Talk to your health care provider about how often you should have regular mammograms. Talk with your health care provider about your test results, treatment options, and if necessary, the need for more  tests. Vaccines  Your health care provider may recommend certain vaccines, such as: Influenza vaccine. This is recommended every year. Tetanus, diphtheria, and acellular pertussis (Tdap, Td) vaccine. You may need a Td booster every 10 years. Zoster vaccine. You may need this after age 24. Pneumococcal 13-valent conjugate (PCV13) vaccine. One dose is recommended after age 54. Pneumococcal polysaccharide (PPSV23) vaccine. One dose is recommended after age 67. Talk to your health care provider about which screenings and vaccines you need and how often you need them. This information is not intended to replace advice given to you by your health care provider. Make sure you discuss any questions you have with your health care provider. Document Released: 07/27/2015 Document Revised: 03/19/2016 Document Reviewed: 05/01/2015 Elsevier Interactive Patient Education  2017 [employer].  Fall Prevention in the Home Falls can cause injuries. They can happen to people of all ages. There are many things you can do to make your home safe and to help prevent falls. What can I do on the outside of my home? Regularly fix the edges of walkways and driveways and fix any cracks. Remove anything that might make you trip as you walk through a door, such as a raised step or threshold. Trim any bushes or trees on the path to your home. Use bright outdoor lighting. Clear any walking paths of anything that might make someone trip, such as rocks or tools. Regularly check to see if handrails are loose or broken. Make sure that both sides of any steps have handrails. Any raised decks and porches should have guardrails on the edges. Have any leaves, snow, or ice cleared regularly. Use sand or salt on walking paths during winter. Clean up any spills in your garage right away. This includes oil or grease spills. What can I do in the bathroom? Use night lights. Install grab bars by the toilet and in the tub and shower.  Do not use towel bars as grab bars. Use non-skid mats or decals in the tub or shower. If you need to sit down in the shower, use a plastic, non-slip stool. Keep the floor dry. Clean up any water that spills on the floor as soon as it happens. Remove soap buildup in the tub or shower regularly. Attach bath mats securely with double-sided non-slip rug tape. Do not have throw rugs and other things on the floor that can make you trip. What can I do in the bedroom? Use night lights. Make sure that you have a light by your bed that is easy to reach. Do not use any sheets or blankets that are too big for your bed. They should not hang down onto the floor. Have a firm chair that has side arms. You can use this for support while you get dressed. Do not have throw rugs and other things on the floor that can make you trip. What can I do in the kitchen? Clean up any spills right away. Avoid walking on wet floors. Keep items that you use a lot in easy-to-reach places. If you need to reach something above you, use  a strong step stool that has a grab bar. Keep electrical cords out of the way. Do not use floor polish or wax that makes floors slippery. If you must use wax, use non-skid floor wax. Do not have throw rugs and other things on the floor that can make you trip. What can I do with my stairs? Do not leave any items on the stairs. Make sure that there are handrails on both sides of the stairs and use them. Fix handrails that are broken or loose. Make sure that handrails are as long as the stairways. Check any carpeting to make sure that it is firmly attached to the stairs. Fix any carpet that is loose or worn. Avoid having throw rugs at the top or bottom of the stairs. If you do have throw rugs, attach them to the floor with carpet tape. Make sure that you have a light switch at the top of the stairs and the bottom of the stairs. If you do not have them, ask someone to add them for you. What else  can I do to help prevent falls? Wear shoes that: Do not have high heels. Have rubber bottoms. Are comfortable and fit you well. Are closed at the toe. Do not wear sandals. If you use a stepladder: Make sure that it is fully opened. Do not climb a closed stepladder. Make sure that both sides of the stepladder are locked into place. Ask someone to hold it for you, if possible. Clearly mark and make sure that you can see: Any grab bars or handrails. First and last steps. Where the edge of each step is. Use tools that help you move around (mobility aids) if they are needed. These include: Canes. Walkers. Scooters. Crutches. Turn on the lights when you go into a dark area. Replace any light bulbs as soon as they burn out. Set up your furniture so you have a clear path. Avoid moving your furniture around. If any of your floors are uneven, fix them. If there are any pets around you, be aware of where they are. Review your medicines with your doctor. Some medicines can make you feel dizzy. This can increase your chance of falling. Ask your doctor what other things that you can do to help prevent falls. This information is not intended to replace advice given to you by your health care provider. Make sure you discuss any questions you have with your health care provider. Document Released: 04/26/2009 Document Revised: 12/06/2015 Document Reviewed: 08/04/2014 Elsevier Interactive Patient Education  2017 [employer].

## 2023-01-27 NOTE — Addendum Note (Signed)
Addended by: Mickeal Needy on: 01/27/2023 04:02 PM   Modules accepted: Orders

## 2023-01-30 ENCOUNTER — Other Ambulatory Visit: Payer: Medicare Other

## 2023-01-30 NOTE — Patient Instructions (Signed)
Visit Information  The Patient                                              was given information about Medicaid Managed Care team care coordination services and consented to engagement with the Medicaid Managed Care team.   Social Worker will follow up in 30 days.   Alexis Shields, BSW, MHA [place]  Managed Medicaid Social Worker [phone]  

## 2023-01-30 NOTE — Patient Outreach (Signed)
Medicaid Managed Care Social Work Note  01/30/2023 Name:  Katrina Simmons MRN:  [id number] DOB:  1941-01-07  Katrina Simmons is an 82 y.o. year old female who is a primary patient of Katrina Broker, MD.  The Medicaid Managed Care Coordination team was consulted for assistance with:  Food Insecurity  Ms. Katrina Simmons was given information about Medicaid Managed Care Coordination team services today. Katrina Simmons Patient agreed to services and verbal consent obtained.   Engaged with patient  for by telephone forinitial visit in response to referral for case management and/or care coordination services.   Assessments/Interventions:  Review of past medical history, allergies, medications, health status, including review of consultants reports, laboratory and other test data, was performed as part of comprehensive evaluation and provision of chronic care management services.  SDOH: (Social Determinant of Health) assessments and interventions performed: SDOH Interventions    Flowsheet Row Clinical Support from 01/27/2023 in [facility] Clinical Support from 01/24/2022 in [facility] [place]  SDOH Interventions    Food Insecurity Interventions AMB Referral, [id number] Referral, Assist with SNAP Application, Other (Comment)  [Social Worker to assist with food stamp application] Intervention Not Indicated  Housing Interventions Intervention Not Indicated Intervention Not Indicated  Transportation Interventions Intervention Not Indicated Intervention Not Indicated  Utilities Interventions Intervention Not Indicated --  Alcohol Usage Interventions Intervention Not Indicated (Score <7) --  Depression Interventions/Treatment  PHQ2-9 Score <4 Follow-up Not Indicated --  Financial Strain Interventions Intervention Not Indicated Intervention Not Indicated  Physical Activity Interventions Patient Declined Intervention Not Indicated, Patient  Refused  Stress Interventions Intervention Not Indicated Intervention Not Indicated  Social Connections Interventions Intervention Not Indicated Intervention Not Indicated  Health Literacy Interventions Intervention Not Indicated --      BSW completed a telephone outreach with patient. She states she lives with her daughter and would like some food resources. Patient states she would like to apply for foodstamps. BSW provided patient with the address for DSS [facility] to apply for foodstamps and medicaid. BSW will also mail patient a list of food pantries. No other resources are needed at this time Advanced Directives Status:  Not addressed in this encounter.  Care Plan                 No Known Allergies  Medications Reviewed Today     Reviewed by Mickeal Needy, LPN (Licensed Practical Nurse) on 01/27/23 at 1532  Med List Status: <None>   Medication Order Taking? Sig Documenting Provider Last Dose Status Informant  amLODipine (NORVASC) 5 MG tablet [id number] No Take 1 tablet (5 mg total) by mouth daily. Katrina Broker, MD Taking Active   aspirin 81 MG tablet [id number] No Take 81 mg by mouth daily. [provider] Taking Active   celecoxib (CELEBREX) 100 MG capsule [id number] No Take 1 capsule (100 mg total) by mouth 2 (two) times daily. Katrina Broker, MD Taking Active   fluticasone [facility]) 50 MCG/ACT nasal spray [id number] No Place 2 sprays into both nostrils daily. Katrina Broker, MD Taking Active   furosemide (LASIX) 20 MG tablet [id number]  Take 1 tablet (20 mg total) by mouth daily. Katrina Broker, MD  Active   glipiZIDE (GLUCOTROL XL) 5 MG 24 hr tablet [id number] No Take 1 tablet by mouth once daily with breakfast Katrina Broker, MD Taking Active   hydrocortisone (PROCTOSOL [facility]) 2.5 % rectal cream [id number] No Place 1 application rectally 2 (two)  times daily. Katrina Broker, MD Taking Active   oxybutynin (DITROPAN) 5 MG tablet  [id number] No Take 1 tablet (5 mg total) by mouth daily as needed for bladder spasms. Katrina Broker, MD Taking Active   simvastatin (ZOCOR) 20 MG tablet [id number] No TAKE 1 TABLET BY MOUTH IN THE EVENING AT 6 PM Annual appt due must see provider for future refills Katrina Broker, MD Taking Active             Patient Active Problem List   Diagnosis Date Noted   Leg swelling 10/21/2022   Acute sinusitis 07/02/2022   Stress incontinence 06/13/2020   Routine general medical examination at a health care facility 04/28/2017   Muscle cramps 04/25/2016   Allergic rhinitis 02/01/2016   Hemorrhoids 11/02/2015   Knee pain 08/10/2015   Type 2 diabetes with complication (HCC) 08/05/2015   Hyperlipidemia associated with type 2 diabetes mellitus (HCC) 08/05/2015   Essential hypertension 08/05/2015   Imaging abnormality 08/05/2015    Conditions to be addressed/monitored per PCP order:   food resources  There are no care plans that you recently modified to display for this patient.   Follow up:  Patient agrees to Care Plan and Follow-up.  Plan: The Managed Medicaid care management team will reach out to the patient again over the next 30 days.  Date/time of next scheduled Social Work care management/care coordination outreach:  03/02/23  Katrina Simmons, Katrina Simmons, [facility]  Managed [facility] Social Worker [phone]

## 2023-03-02 ENCOUNTER — Other Ambulatory Visit: Payer: Medicare Other

## 2023-03-02 NOTE — Patient Outreach (Signed)
  Medicaid Managed Care   Unsuccessful Outreach Note  03/02/2023 Name: Mikelyn Goepfert MRN: [id number] DOB: June 09, 1941  Referred by: Myrlene Broker, MD Reason for referral : High Risk Managed Medicaid (MM social work unsuccessful telephone outreach )   An unsuccessful telephone outreach was attempted today. The patient was referred to the case management team for assistance with care management and care coordination.   Follow Up Plan: The care management team will reach out to the patient again over the next 30 days.   Abelino Derrick, MHA [facility]  Managed [facility] Social Worker [phone]

## 2023-03-02 NOTE — Patient Instructions (Signed)
  Medicaid Managed Care   Unsuccessful Outreach Note  03/02/2023 Name: Kassidee Hower MRN: [id number] DOB: [birth year]  Referred by: Myrlene Broker, MD Reason for referral : High Risk Managed Medicaid (MM social work unsuccessful telephone outreach )   An unsuccessful telephone outreach was attempted today. The patient was referred to the case management team for assistance with care management and care coordination.   Follow Up Plan: The care management team will reach out to the patient again over the next 30 days.   Abelino Derrick, MHA [facility]  Managed [facility] Social Worker [phone]

## 2023-03-18 ENCOUNTER — Telehealth: Payer: Self-pay

## 2023-03-18 NOTE — Telephone Encounter (Signed)
..   Medicaid Managed Care   Unsuccessful Outreach Note  03/18/2023 Name: Katrina Simmons MRN: [id number] DOB: 13-Apr-1941  Referred by: Myrlene Broker, MD Reason for referral : Appointment (I called to reschedule her missed phone appointment wit the MM BSW. She did not answer and I was not able to leave a message.)   A second unsuccessful telephone outreach was attempted today. The patient was referred to the case management team for assistance with care management and care coordination.   Follow Up Plan: The care management team will reach out to the patient again over the next 7 days.   Weston Settle Care Guide  [facility] Managed  Care Guide [facility]  [phone]

## 2023-04-01 ENCOUNTER — Other Ambulatory Visit: Payer: Medicare Other

## 2023-04-01 NOTE — Patient Outreach (Signed)
Medicaid Managed Care   Unsuccessful Outreach Note  04/01/2023 Name: Prapti Flow MRN: [id number] DOB: 1941-05-11  Referred by: Myrlene Broker, MD Reason for referral : High Risk Managed Medicaid (MM social work unsuccessful telephone outreach )   A second unsuccessful telephone outreach was attempted today. The patient was referred to the case management team for assistance with care management and care coordination.   Follow Up Plan: The care management team will reach out to the patient again over the next 7-10 days.   Abelino Derrick, MHA [facility]  Managed [facility] Social Worker [phone]

## 2023-04-01 NOTE — Patient Instructions (Signed)
Medicaid Managed Care   Unsuccessful Outreach Note  04/01/2023 Name: Katrina Simmons MRN: [id number] DOB: 1941-05-11  Referred by: Myrlene Broker, MD Reason for referral : High Risk Managed Medicaid (MM social work unsuccessful telephone outreach )   A second unsuccessful telephone outreach was attempted today. The patient was referred to the case management team for assistance with care management and care coordination.   Follow Up Plan: The care management team will reach out to the patient again over the next 7-10 days.   Abelino Derrick, MHA [facility]  Managed [facility] Social Worker [phone]

## 2023-06-02 ENCOUNTER — Other Ambulatory Visit: Payer: Self-pay | Admitting: Internal Medicine

## 2023-06-23 ENCOUNTER — Other Ambulatory Visit: Payer: Self-pay | Admitting: Internal Medicine

## 2023-08-28 ENCOUNTER — Other Ambulatory Visit: Payer: Self-pay | Admitting: Internal Medicine

## 2023-09-06 ENCOUNTER — Other Ambulatory Visit: Payer: Self-pay | Admitting: Internal Medicine

## 2023-09-28 ENCOUNTER — Other Ambulatory Visit: Payer: Self-pay | Admitting: Internal Medicine

## 2023-10-01 ENCOUNTER — Telehealth: Payer: Self-pay | Admitting: Internal Medicine

## 2023-10-01 NOTE — Telephone Encounter (Signed)
 Called patient about lab result update and she is overdue for repeat testing and diabetes testing so made her an appointment to come get her regular routine care. Will make adjustments as needed after that.

## 2023-10-13 ENCOUNTER — Ambulatory Visit (INDEPENDENT_AMBULATORY_CARE_PROVIDER_SITE_OTHER): Admitting: Internal Medicine

## 2023-10-13 ENCOUNTER — Encounter: Payer: Self-pay | Admitting: Internal Medicine

## 2023-10-13 VITALS — BP 138/68 | HR 102 | Temp 98.4°F | Ht 62.0 in | Wt 160.0 lb

## 2023-10-13 DIAGNOSIS — E118 Type 2 diabetes mellitus with unspecified complications: Secondary | ICD-10-CM | POA: Diagnosis not present

## 2023-10-13 DIAGNOSIS — Z0000 Encounter for general adult medical examination without abnormal findings: Secondary | ICD-10-CM | POA: Diagnosis not present

## 2023-10-13 DIAGNOSIS — G8929 Other chronic pain: Secondary | ICD-10-CM

## 2023-10-13 DIAGNOSIS — Z7984 Long term (current) use of oral hypoglycemic drugs: Secondary | ICD-10-CM

## 2023-10-13 DIAGNOSIS — I10 Essential (primary) hypertension: Secondary | ICD-10-CM | POA: Diagnosis not present

## 2023-10-13 DIAGNOSIS — E785 Hyperlipidemia, unspecified: Secondary | ICD-10-CM

## 2023-10-13 DIAGNOSIS — M25561 Pain in right knee: Secondary | ICD-10-CM

## 2023-10-13 DIAGNOSIS — N393 Stress incontinence (female) (male): Secondary | ICD-10-CM

## 2023-10-13 DIAGNOSIS — E1169 Type 2 diabetes mellitus with other specified complication: Secondary | ICD-10-CM | POA: Diagnosis not present

## 2023-10-13 DIAGNOSIS — M25562 Pain in left knee: Secondary | ICD-10-CM

## 2023-10-13 DIAGNOSIS — I872 Venous insufficiency (chronic) (peripheral): Secondary | ICD-10-CM

## 2023-10-13 LAB — COMPREHENSIVE METABOLIC PANEL WITH GFR
ALT: 13 U/L (ref 0–35)
AST: 18 U/L (ref 0–37)
Albumin: 4.5 g/dL (ref 3.5–5.2)
Alkaline Phosphatase: 94 U/L (ref 39–117)
BUN: 13 mg/dL (ref 6–23)
CO2: 27 meq/L (ref 19–32)
Calcium: 9.8 mg/dL (ref 8.4–10.5)
Chloride: 99 meq/L (ref 96–112)
Creatinine, Ser: 0.77 mg/dL (ref 0.40–1.20)
GFR: 70.69 mL/min (ref >60.00)
Glucose, Bld: 191 mg/dL — ABNORMAL HIGH (ref 70–99)
Potassium: 3.5 meq/L (ref 3.5–5.1)
Sodium: 138 meq/L (ref 135–145)
Total Bilirubin: 0.5 mg/dL (ref 0.2–1.2)
Total Protein: 8.1 g/dL (ref 6.0–8.3)

## 2023-10-13 LAB — LIPID PANEL
Cholesterol: 161 mg/dL (ref 0–200)
HDL: 56.7 mg/dL (ref >39.00)
LDL Cholesterol: 50 mg/dL (ref 0–99)
NonHDL: 104.66
Total CHOL/HDL Ratio: 3
Triglycerides: 272 mg/dL — ABNORMAL HIGH (ref 0.0–149.0)
VLDL: 54.4 mg/dL — ABNORMAL HIGH (ref 0.0–40.0)

## 2023-10-13 LAB — CBC
HCT: 41.7 % (ref 36.0–46.0)
Hemoglobin: 14.1 g/dL (ref 12.0–15.0)
MCHC: 33.9 g/dL (ref 30.0–36.0)
MCV: 89.2 fl (ref 78.0–100.0)
Platelets: 254 10*3/uL (ref 150.0–400.0)
RBC: 4.67 Mil/uL (ref 3.87–5.11)
RDW: 13.9 % (ref 11.5–15.5)
WBC: 8.2 10*3/uL (ref 4.0–10.5)

## 2023-10-13 LAB — HEMOGLOBIN A1C: Hgb A1c MFr Bld: 6.7 % — ABNORMAL HIGH (ref 4.6–6.5)

## 2023-10-13 LAB — MICROALBUMIN / CREATININE URINE RATIO
Creatinine,U: 83 mg/dL
Microalb Creat Ratio: 74.1 mg/g — ABNORMAL HIGH (ref 0.0–30.0)
Microalb, Ur: 6.2 mg/dL — ABNORMAL HIGH (ref 0.0–1.9)

## 2023-10-13 MED ORDER — CELECOXIB 100 MG PO CAPS: 100.0000 mg | ORAL_CAPSULE | Freq: Two times a day (BID) | ORAL | 3 refills | Status: AC

## 2023-10-13 MED ORDER — OXYBUTYNIN CHLORIDE 5 MG PO TABS
5.0000 mg | ORAL_TABLET | Freq: Every day | ORAL | 3 refills | Status: AC | PRN
Start: 2023-10-13 — End: ?

## 2023-10-13 NOTE — Assessment & Plan Note (Signed)
 Flu shot due next season. Pneumonia declines. Shingrix declines. Tetanus declines. Colonoscopy aged out. Mammogram aged out, pap smear aged out and dexa declines. Counseled about sun safety and mole surveillance. Counseled about the dangers of distracted driving. Given 10 year screening recommendations.

## 2023-10-13 NOTE — Assessment & Plan Note (Signed)
 Refill celebrex 100 mg daily prn which she uses rarely. Taking tylenol and using otc patches lidocaine for pain mostly.

## 2023-10-13 NOTE — Assessment & Plan Note (Signed)
 BP at goal on amlodipine 5 mg daily. We discussed changing to help with swelling to add ARB instead but she declines.

## 2023-10-13 NOTE — Progress Notes (Signed)
   Subjective:   Patient ID: Katrina Simmons, female    DOB: [birth year], 83 y.o.   MRN: [id number]  HPI The patient is here for physical.  PMH, [facility], social history reviewed and updated  Review of Systems  Constitutional:  Positive for activity change and fatigue.  HENT: Negative.    Eyes: Negative.   Respiratory:  Negative for cough, chest tightness and shortness of breath.   Cardiovascular:  Positive for leg swelling. Negative for chest pain and palpitations.  Gastrointestinal:  Negative for abdominal distention, abdominal pain, constipation, diarrhea, nausea and vomiting.  Genitourinary:  Positive for frequency and urgency.  Musculoskeletal: Negative.   Skin: Negative.   Neurological: Negative.   Psychiatric/Behavioral: Negative.      Objective:  Physical Exam Constitutional:      Appearance: She is well-developed.  HENT:     Head: Normocephalic and atraumatic.  Cardiovascular:     Rate and Rhythm: Normal rate and regular rhythm.  Pulmonary:     Effort: Pulmonary effort is normal. No respiratory distress.     Breath sounds: Normal breath sounds. No wheezing or rales.  Abdominal:     General: Bowel sounds are normal. There is no distension.     Palpations: Abdomen is soft.     Tenderness: There is no abdominal tenderness. There is no rebound.  Musculoskeletal:     Cervical back: Normal range of motion.     Right lower leg: Edema present.     Left lower leg: Edema present.     Comments: Stable edema, pain in the knees bilaterally  Skin:    General: Skin is warm and dry.  Neurological:     Mental Status: She is alert and oriented to person, place, and time.     Coordination: Coordination normal.     Comments: Foot exam done     Vitals:   10/13/23 1441  BP: 138/68  Pulse: (!) 102  Temp: 98.4 F (36.9 C)  TempSrc: Oral  SpO2: 99%  Weight: 160 lb (72.6 kg)  Height: 5\' 2"  (1.575 m)    Assessment & Plan:

## 2023-10-13 NOTE — Assessment & Plan Note (Signed)
 For urine ratio and HGA1c and CMP and lipid panel. Taking glipizide 5 mg daily and is not on ARB. Advised we should switch amlodipine to ARB and they did not want to do this today. Is on statin.

## 2023-10-13 NOTE — Assessment & Plan Note (Signed)
 Rx compression stockings. Reviewed labs from last year which are not indicative of another cause.

## 2023-10-13 NOTE — Patient Instructions (Signed)
 We have refilled the baldder medicine and the celebrex.  We have given you the compression stocking and dmv form.

## 2023-10-13 NOTE — Assessment & Plan Note (Signed)
 Oxybutynin working well but ran out some time ago. Refilled today.

## 2023-10-13 NOTE — Assessment & Plan Note (Signed)
Checking lipid panel and adjust simvastatin 20 mg daily.  °

## 2023-11-24 ENCOUNTER — Other Ambulatory Visit: Payer: Self-pay | Admitting: Internal Medicine

## 2023-12-08 ENCOUNTER — Other Ambulatory Visit: Payer: Self-pay | Admitting: Internal Medicine

## 2023-12-08 ENCOUNTER — Other Ambulatory Visit: Payer: Self-pay

## 2023-12-08 DIAGNOSIS — I10 Essential (primary) hypertension: Secondary | ICD-10-CM

## 2023-12-08 MED ORDER — AMLODIPINE BESYLATE 5 MG PO TABS: 5.0000 mg | ORAL_TABLET | Freq: Every day | ORAL | 0 refills | Status: AC

## 2023-12-21 ENCOUNTER — Other Ambulatory Visit: Payer: Self-pay | Admitting: Internal Medicine

## 2024-01-28 ENCOUNTER — Ambulatory Visit: Payer: Medicare Other

## 2024-01-28 VITALS — Ht 62.0 in | Wt 160.0 lb

## 2024-01-28 DIAGNOSIS — Z0000 Encounter for general adult medical examination without abnormal findings: Secondary | ICD-10-CM

## 2024-01-28 NOTE — Progress Notes (Signed)
 Subjective:   Katrina Simmons is a 83 y.o. who presents for a Medicare Wellness preventive visit.  As a reminder, Annual Wellness Visits don't include a physical exam, and some assessments may be limited, especially if this visit is performed virtually. We may recommend an in-person follow-up visit with your provider if needed.  Visit Complete: Virtual I connected with  Heron Ellen on 01/28/24 by a audio enabled telemedicine application and verified that I am speaking with the correct person using two identifiers.  Patient Location: Home  Provider Location: Office/Clinic  I discussed the limitations of evaluation and management by telemedicine. The patient expressed understanding and agreed to proceed.  Vital Signs: Because this visit was a virtual/telehealth visit, some criteria may be missing or patient reported. Any vitals not documented were not able to be obtained and vitals that have been documented are patient reported.  VideoDeclined- This patient declined Librarian, academic. Therefore the visit was completed with audio only.  Persons Participating in Visit: Patient.  AWV Questionnaire: No: Patient Medicare AWV questionnaire was not completed prior to this visit.  Cardiac Risk Factors include: hypertension;advanced age (>50men, >48 women);diabetes mellitus;dyslipidemia     Objective:    Today's Vitals   01/28/24 1507  Weight: 160 lb (72.6 kg)  Height: 5' 2 (1.575 m)   Body mass index is 29.26 kg/m.     01/28/2024    3:20 PM 01/27/2023    3:07 PM 01/24/2022    9:08 AM 01/25/2021    9:53 AM  Advanced Directives  Does Patient Have a Medical Advance Directive? Yes Yes Yes No  Type of Estate agent of [place];Living will Healthcare Power of [place];Living will Living will;Healthcare Power of Attorney   Does patient want to make changes to medical advance directive?   No - Patient declined   Copy of Healthcare  Power of Attorney in Chart? No - copy requested No - copy requested No - copy requested   Would patient like information on creating a medical advance directive?    No - Patient declined    Current Medications (verified) Outpatient Encounter Medications as of 01/28/2024  Medication Sig   amLODipine  (NORVASC ) 5 MG tablet Take 1 tablet by mouth once daily   amLODipine  (NORVASC ) 5 MG tablet Take 1 tablet (5 mg total) by mouth daily.   aspirin 81 MG tablet Take 81 mg by mouth daily.   celecoxib  (CELEBREX ) 100 MG capsule Take 1 capsule (100 mg total) by mouth 2 (two) times daily.   fluticasone  (FLONASE ) 50 MCG/ACT nasal spray Place 2 sprays into both nostrils daily.   glipiZIDE  (GLUCOTROL  XL) 5 MG 24 hr tablet Take 1 tablet by mouth once daily with breakfast   hydrocortisone  (PROCTOSOL HC) 2.5 % rectal cream Place 1 application rectally 2 (two) times daily.   oxybutynin  (DITROPAN ) 5 MG tablet Take 1 tablet (5 mg total) by mouth daily as needed for bladder spasms.   simvastatin  (ZOCOR ) 20 MG tablet TAKE 1 TABLET BY MOUTH IN THE EVENING AT 6PM **APPOINTMENT REQUIRED FOR FUTURE REFILLS**   No facility-administered encounter medications on file as of 01/28/2024.    Allergies (verified) Patient has no known allergies.   History: Past Medical History:  Diagnosis Date   Allergy    Arthritis    Diabetes mellitus without complication (HCC)    Diverticulosis    GERD (gastroesophageal reflux disease)    Hiatal hernia    History of colon polyps    Hypercholesteremia  Hyperlipidemia    Hypertension    Hyperthyroidism    Internal hemorrhoids    Lesion of right lobe of liver    Pancreatic cyst    Urinary incontinence    Past Surgical History:  Procedure Laterality Date   ABDOMINAL HYSTERECTOMY     Family History  Problem Relation Age of Onset   Stroke Mother    Hypertension Mother    Diabetes Mother    Cancer Father        lung and prostate   Social History   Socioeconomic History    Marital status: Divorced    Spouse name: Not on file   Number of children: 3   Years of education: Not on file   Highest education level: Not on file  Occupational History   Occupation: RETIRED  Tobacco Use   Smoking status: Former   Smokeless tobacco: Never  Advertising account planner   Vaping status: Never Used  Substance and Sexual Activity   Alcohol use: No    Alcohol/week: 0.0 standard drinks of alcohol   Drug use: No   Sexual activity: Not on file  Other Topics Concern   Not on file  Social History Narrative   Lives with her daughter/2025   Social Drivers of Health   Financial Resource Strain: Low Risk  (01/28/2024)   Overall Financial Resource Strain (CARDIA)    Difficulty of Paying Living Expenses: Not very hard  Food Insecurity: No Food Insecurity (01/28/2024)   Hunger Vital Sign    Worried About Running Out of Food in the Last Year: Never true    Ran Out of Food in the Last Year: Never true  Transportation Needs: No Transportation Needs (01/28/2024)   PRAPARE - Administrator, Civil Service (Medical): No    Lack of Transportation (Non-Medical): No  Physical Activity: Inactive (01/28/2024)   Exercise Vital Sign    Days of Exercise per Week: 0 days    Minutes of Exercise per Session: 0 min  Stress: No Stress Concern Present (01/28/2024)   [employer] of Occupational Health - Occupational Stress Questionnaire    Feeling of Stress: Only a little  Social Connections: Socially Isolated (01/28/2024)   Social Connection and Isolation Panel    Frequency of Communication with Friends and Family: More than three times a week    Frequency of Social Gatherings with Friends and Family: More than three times a week    Attends Religious Services: Never    Database administrator or Organizations: No    Attends Engineer, structural: Never    Marital Status: Divorced    Tobacco Counseling Counseling given: Not Answered    Clinical Intake:  Pre-visit  preparation completed: Yes  Pain : No/denies pain     BMI - recorded: 29.26 Nutritional Status: BMI 25 -29 Overweight Nutritional Risks: None Diabetes: Yes CBG done?: No Did pt. bring in CBG monitor from home?: No  Lab Results  Component Value Date   HGBA1C 6.7 (H) 10/13/2023   HGBA1C 6.5 07/01/2022   HGBA1C 6.6 (H) 06/24/2021     How often do you need to have someone help you when you read instructions, pamphlets, or other written materials from your doctor or pharmacy?: 1 - Never  Interpreter Needed?: No  Information entered by :: Derec Mozingo, RMA   Activities of Daily Living     01/28/2024    3:08 PM  In your present state of health, do you have any difficulty performing the  following activities:  Hearing? 1  Comment has hearing aides  Vision? 0  Difficulty concentrating or making decisions? 0  Walking or climbing stairs? 0  Dressing or bathing? 0  Doing errands, shopping? 0  Comment daughter drives her around  Preparing Food and eating ? N  Using the Toilet? N  In the past six months, have you accidently leaked urine? N  Do you have problems with loss of bowel control? N  Managing your Medications? N  Managing your Finances? N  Housekeeping or managing your Housekeeping? N    Patient Care Team: Rollene Almarie LABOR, MD as PCP - General (Internal Medicine) Debarah Lorrene DEL., MD as Consulting Physician (Ophthalmology)  I have updated your Care Teams any recent Medical Services you may have received from other providers in the past year.     Assessment:   This is a routine wellness examination for [place].  Hearing/Vision screen Hearing Screening - Comments:: has hearing aides Vision Screening - Comments:: Wears eyeglasses/ find a new eye doctor   Goals Addressed             This Visit's Progress    My goal for 2024 is to maintain my health.   On track      Depression Screen     01/28/2024    3:31 PM 01/27/2023    3:12 PM 07/01/2022    3:17  PM 01/24/2022    9:19 AM 01/25/2021   10:01 AM 06/11/2020    3:58 PM 03/08/2019    3:49 PM  PHQ 2/9 Scores  PHQ - 2 Score 1 2 0 0 0 0 0  PHQ- 9 Score 1 4 0        Fall Risk     01/28/2024    3:22 PM 10/13/2023    2:54 PM 01/27/2023    3:09 PM 10/21/2022    3:41 PM 07/01/2022    3:16 PM  Fall Risk   Falls in the past year? 0 0 0 0 0  Number falls in past yr: 0 0 0 0 0  Injury with Fall? 0 0 0 0 0  Risk for fall due to :   No Fall Risks    Follow up Falls evaluation completed;Falls prevention discussed Falls evaluation completed Falls prevention discussed Falls evaluation completed Falls evaluation completed      Data saved with a previous flowsheet row definition    MEDICARE RISK AT HOME:  Medicare Risk at Home Any stairs in or around the home?: No If so, are there any without handrails?: No Home free of loose throw rugs in walkways, pet beds, electrical cords, etc?: Yes Adequate lighting in your home to reduce risk of falls?: Yes Life alert?: No Use of a cane, walker or w/c?: No Grab bars in the bathroom?: Yes Shower chair or bench in shower?: No Elevated toilet seat or a handicapped toilet?: No  TIMED UP AND GO:  Was the test performed?  No  Cognitive Function: Declined/Normal: No cognitive concerns noted by patient or family. Patient alert, oriented, able to answer questions appropriately and recall recent events. No signs of memory loss or confusion.        01/27/2023    3:10 PM 01/24/2022    9:22 AM  6CIT Screen  What Year? 0 points 0 points  What month? 0 points 0 points  What time? 0 points 0 points  Count back from 20 0 points 0 points  Months in reverse 0 points 0 points  Repeat phrase 0 points 0 points  Total Score 0 points 0 points    Immunizations Immunization History  Administered Date(s) Administered   PFIZER(Purple Top)SARS-COV-2 Vaccination 10/08/2019, 07/19/2020, 10/28/2020   Pneumococcal Conjugate-13 09/12/2014    Screening Tests Health  Maintenance  Topic Date Due   DTaP/Tdap/Td (1 - Tdap) Never done   Zoster Vaccines- Shingrix (1 of 2) Never done   DEXA SCAN  Never done   Pneumococcal Vaccine: 50+ Years (2 of 2 - PPSV23, PCV20, or PCV21) 11/07/2014   OPHTHALMOLOGY EXAM  09/12/2016   COVID-19 Vaccine (4 - 2024-25 season) 03/15/2023   Medicare Annual Wellness (AWV)  01/27/2024   INFLUENZA VACCINE  02/12/2024   Diabetic kidney evaluation - eGFR measurement  10/12/2024   Diabetic kidney evaluation - Urine ACR  10/12/2024   FOOT EXAM  10/12/2024   Hepatitis B Vaccines  Aged Out   HPV VACCINES  Aged Out   Meningococcal B Vaccine  Aged Out    Health Maintenance  Health Maintenance Due  Topic Date Due   DTaP/Tdap/Td (1 - Tdap) Never done   Zoster Vaccines- Shingrix (1 of 2) Never done   DEXA SCAN  Never done   Pneumococcal Vaccine: 50+ Years (2 of 2 - PPSV23, PCV20, or PCV21) 11/07/2014   OPHTHALMOLOGY EXAM  09/12/2016   COVID-19 Vaccine (4 - 2024-25 season) 03/15/2023   Medicare Annual Wellness (AWV)  01/27/2024   Health Maintenance Items Addressed: See Nurse Notes at the end of this note  Additional Screening:  Vision Screening: Recommended annual ophthalmology exams for early detection of glaucoma and other disorders of the eye. Would you like a referral to an eye doctor? Yes    Dental Screening: Recommended annual dental exams for proper oral hygiene  Community Resource Referral / Chronic Care Management: CRR required this visit?  No   CCM required this visit?  No   Plan:    I have personally reviewed and noted the following in the patient's chart:   Medical and social history Use of alcohol, tobacco or illicit drugs  Current medications and supplements including opioid prescriptions. Patient is not currently taking opioid prescriptions. Functional ability and status Nutritional status Physical activity Advanced directives List of other physicians Hospitalizations, surgeries, and ER visits in  previous 12 months Vitals Screenings to include cognitive, depression, and falls Referrals and appointments  In addition, I have reviewed and discussed with patient certain preventive protocols, quality metrics, and best practice recommendations. A written personalized care plan for preventive services as well as general preventive health recommendations were provided to patient.   Nalah Macioce L Shafter Jupin, CMA   01/28/2024   After Visit Summary: (Mail) Due to this being a telephonic visit, the after visit summary with patients personalized plan was offered to patient via mail   Notes: Patient is due for a DEXA, however, she declines it. Patient also declines all vaccine at this time.  She stated that she would like a different eye doctor, as she did not like the services where she last went.  Patient stated that she will be call office to speak with Dr. Marcel nurse due to a Rx for compression socks.  She stated that she needed to know where to go to get fitted for the socks.  I tried to advise patient, however she wanted to speak with Dr. Marcel nurse.  Patient had no other concerns to address today.

## 2024-01-28 NOTE — Patient Instructions (Signed)
 Katrina Simmons , Thank you for taking time out of your busy schedule to complete your Annual Wellness Visit with me. I enjoyed our conversation and look forward to speaking with you again next year. I, as well as your care team,  appreciate your ongoing commitment to your health goals. Please review the following plan we discussed and let me know if I can assist you in the future. Your Game plan/ To Do List   Follow up Visits: Next Medicare AWV with our clinical staff: Patient prefers a telephone visit/ 01/31/2025.   Have you seen your provider in the last 6 months (3 months if uncontrolled diabetes)? Yes Next Office Visit with your provider: Patient will call the office close to her 6 month follow up visit.  Last office visit was on 10/13/2023.  Clinician Recommendations:  Aim for 30 minutes of exercise or brisk walking, 6-8 glasses of water, and 5 servings of fruits and vegetables each day.       This is a list of the screening recommended for you and due dates:  Health Maintenance  Topic Date Due   DTaP/Tdap/Td vaccine (1 - Tdap) Never done   Zoster (Shingles) Vaccine (1 of 2) Never done   DEXA scan (bone density measurement)  Never done   Pneumococcal Vaccine for age over 48 (2 of 2 - PPSV23, PCV20, or PCV21) 11/07/2014   Eye exam for diabetics  09/12/2016   COVID-19 Vaccine (4 - 2024-25 season) 03/15/2023   Medicare Annual Wellness Visit  01/27/2024   Flu Shot  02/12/2024   Yearly kidney function blood test for diabetes  10/12/2024   Yearly kidney health urinalysis for diabetes  10/12/2024   Complete foot exam   10/12/2024   Hepatitis B Vaccine  Aged Out   HPV Vaccine  Aged Out   Meningitis B Vaccine  Aged Out    Advanced directives: (Copy Requested) Please bring a copy of your health care power of attorney and living will to the office to be added to your chart at your convenience. You can mail to [facility] [address] [place], [place] [address] or email to  [email or web address] Advance Care Planning is important because it:  [x]  Makes sure you receive the medical care that is consistent with your values, goals, and preferences  [x]  It provides guidance to your family and loved ones and reduces their decisional burden about whether or not they are making the right decisions based on your wishes.  Follow the link provided in your after visit summary or read over the paperwork we have mailed to you to help you started getting your Advance Directives in place. If you need assistance in completing these, please reach out to us  so that we can help you!  See attachments for Preventive Care and Fall Prevention Tips.

## 2024-02-02 ENCOUNTER — Telehealth: Payer: Self-pay

## 2024-02-02 NOTE — Telephone Encounter (Signed)
 Copied from CRM [id number]. Topic: General - Other >> Feb 02, 2024  2:03 PM Thersia C wrote: Reason for CRM: Patient called in regarding compression stocking , stated she needs to know where she needs to go to pick them off, would like fro a nurse to give her a callback regarding this

## 2024-02-03 NOTE — Telephone Encounter (Signed)
 Called patient 1st attempt

## 2024-02-19 ENCOUNTER — Other Ambulatory Visit: Payer: Self-pay | Admitting: Internal Medicine

## 2024-05-16 ENCOUNTER — Other Ambulatory Visit: Payer: Self-pay | Admitting: Internal Medicine

## 2024-05-24 ENCOUNTER — Encounter: Payer: Self-pay | Admitting: Internal Medicine

## 2024-05-24 ENCOUNTER — Ambulatory Visit (INDEPENDENT_AMBULATORY_CARE_PROVIDER_SITE_OTHER): Admitting: Internal Medicine

## 2024-05-24 VITALS — BP 150/80 | HR 112 | Temp 98.3°F | Ht 62.0 in | Wt 164.8 lb

## 2024-05-24 DIAGNOSIS — M79671 Pain in right foot: Secondary | ICD-10-CM | POA: Diagnosis not present

## 2024-05-24 DIAGNOSIS — G8929 Other chronic pain: Secondary | ICD-10-CM

## 2024-05-24 DIAGNOSIS — I872 Venous insufficiency (chronic) (peripheral): Secondary | ICD-10-CM

## 2024-05-24 DIAGNOSIS — M25561 Pain in right knee: Secondary | ICD-10-CM

## 2024-05-24 DIAGNOSIS — M79672 Pain in left foot: Secondary | ICD-10-CM

## 2024-05-24 DIAGNOSIS — M25562 Pain in left knee: Secondary | ICD-10-CM

## 2024-05-24 NOTE — Progress Notes (Unsigned)
 Subjective:   Patient ID: Katrina Simmons, female    DOB: September 30, 1940, 83 y.o.   MRN: [id number]  Discussed the use of AI scribe software for clinical note transcription with the patient, who gave verbal consent to proceed.  History of Present Illness Katrina Simmons is an 83 year old female with chronic venous insufficiency who presents with worsening swelling and pain in her feet.  She experiences persistent swelling in her right foot, which is sore on top and aching. The swelling worsens throughout the day, particularly when she is on her feet, and improves overnight with leg elevation. She has a history of leg and foot swelling, previously evaluated with an EKG and other organ function tests about a year and a half ago. Initially prescribed diuretics, she is no longer taking them. She also takes medication for bladder spasms.  She describes a painful area on her left foot, which she refers to as a 'corn' or bunion. Despite applying Neosporin, the area remains painful, especially when walking. The pain is described as a pressure that worsens with movement and is located on the surface and underneath the foot.  She has a history of knee problems, including a previous knee replacement and arthritis in both knees. Her right knee is painful, and her left knee was confirmed by past x-rays to be bone-on-bone. She has previously received gel injections in both knees without relief and uses over-the-counter knee braces and medical pads for support.  Her right foot was x-rayed in 2017, revealing arthritis and bone spurs. She experiences constant pain in the right foot, and she has a history of arthritis and bone spurs in that foot.  Review of Systems  Constitutional: Negative.   HENT: Negative.    Eyes: Negative.   Respiratory:  Negative for cough, chest tightness and shortness of breath.   Cardiovascular:  Positive for leg swelling. Negative for chest pain and palpitations.  Gastrointestinal:   Negative for abdominal distention, abdominal pain, constipation, diarrhea, nausea and vomiting.  Musculoskeletal:  Positive for arthralgias, gait problem and myalgias.  Skin: Negative.   Psychiatric/Behavioral: Negative.      Objective:  Physical Exam Constitutional:      Appearance: She is well-developed.  HENT:     Head: Normocephalic and atraumatic.  Cardiovascular:     Rate and Rhythm: Normal rate and regular rhythm.  Pulmonary:     Effort: Pulmonary effort is normal. No respiratory distress.     Breath sounds: Normal breath sounds. No wheezing or rales.  Abdominal:     General: Bowel sounds are normal. There is no distension.     Palpations: Abdomen is soft.     Tenderness: There is no abdominal tenderness.  Musculoskeletal:     Cervical back: Normal range of motion.     Right lower leg: Edema present.     Left lower leg: Edema present.     Comments: Left foot corn  Skin:    General: Skin is warm and dry.  Neurological:     Mental Status: She is alert and oriented to person, place, and time.     Coordination: Coordination normal.     Vitals:   05/24/24 1528  BP: (!) 150/80  Pulse: (!) 112  Temp: 98.3 F (36.8 C)  TempSrc: Oral  SpO2: 97%  Weight: 164 lb 12.8 oz (74.8 kg)  Height: 5' 2 (1.575 m)    Assessment and Plan Assessment & Plan Chronic venous insufficiency with right foot and leg swelling   Chronic venous  insufficiency is likely due to leaky venous valves causing fluid accumulation. Previous workup ruled out cardiac, renal, and hepatic causes. Recommend compression stockings to reduce venous leakage and swelling.  Painful corn on left foot   A painful corn on the left foot is causing significant discomfort due to pressure and friction. Referred to a podiatrist for evaluation and management.  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 Previous gel injections were ineffective. Pain and swelling are likely due to bone-on-bone contact. Referred to an orthopedic specialist for evaluation and management. Discussed potential for cortisone or steroid injections. Consider supportive devices or braces as recommended.  Right foot pain, likely secondary to osteoarthritis and bone spurs   Right foot pain is likely due to osteoarthritis and bone spurs, as indicated by previous x-rays. Pain is persistent and exacerbated by pressure. Referred to a podiatrist for evaluation and management. Will consider an x-ray of the right foot to assess changes since last imaging.

## 2024-05-24 NOTE — Patient Instructions (Signed)
 We would recommend to use the compression stockings for the feet.  We will get you in with the podiatrist to help with the pain in the feet.

## 2024-05-26 DIAGNOSIS — M79671 Pain in right foot: Secondary | ICD-10-CM | POA: Insufficient documentation

## 2024-05-26 NOTE — Assessment & Plan Note (Signed)
 Left foot corn and suspect arthritis causing pain in the right. Referral to podiatry to address both.

## 2024-05-26 NOTE — Assessment & Plan Note (Signed)
 Discussed her swelling that this is venous insufficiency and explained this to her. No medication is recommended. Advised compression stockings which we have given her rx for in the past. She is advised to fill this.

## 2024-05-26 NOTE — Assessment & Plan Note (Signed)
 Osteoarthritis in both knees presents with moderate to severe arthritis, particularly in the right knee. Previous gel injections were ineffective. Pain and swelling are likely due to bone-on-bone contact. Referred to an orthopedic specialist for evaluation and management. Discussed potential for cortisone or steroid injections. Consider supportive devices or braces as recommended.

## 2024-06-04 ENCOUNTER — Other Ambulatory Visit: Payer: Self-pay | Admitting: Internal Medicine

## 2024-06-06 ENCOUNTER — Other Ambulatory Visit: Payer: Self-pay

## 2024-06-06 ENCOUNTER — Telehealth: Payer: Self-pay

## 2024-06-06 ENCOUNTER — Ambulatory Visit: Admitting: Family Medicine

## 2024-06-06 ENCOUNTER — Ambulatory Visit

## 2024-06-06 VITALS — BP 148/80 | HR 120 | Ht 62.0 in | Wt 163.0 lb

## 2024-06-06 DIAGNOSIS — M7989 Other specified soft tissue disorders: Secondary | ICD-10-CM | POA: Diagnosis not present

## 2024-06-06 DIAGNOSIS — R600 Localized edema: Secondary | ICD-10-CM

## 2024-06-06 DIAGNOSIS — M25571 Pain in right ankle and joints of right foot: Secondary | ICD-10-CM

## 2024-06-06 DIAGNOSIS — M7731 Calcaneal spur, right foot: Secondary | ICD-10-CM | POA: Diagnosis not present

## 2024-06-06 DIAGNOSIS — G8929 Other chronic pain: Secondary | ICD-10-CM

## 2024-06-06 DIAGNOSIS — M1711 Unilateral primary osteoarthritis, right knee: Secondary | ICD-10-CM | POA: Diagnosis not present

## 2024-06-06 DIAGNOSIS — M25561 Pain in right knee: Secondary | ICD-10-CM | POA: Diagnosis not present

## 2024-06-06 DIAGNOSIS — M25462 Effusion, left knee: Secondary | ICD-10-CM | POA: Diagnosis not present

## 2024-06-06 DIAGNOSIS — M25562 Pain in left knee: Secondary | ICD-10-CM

## 2024-06-06 DIAGNOSIS — M1712 Unilateral primary osteoarthritis, left knee: Secondary | ICD-10-CM | POA: Diagnosis not present

## 2024-06-06 NOTE — Progress Notes (Unsigned)
   I, Claretha Schimke am a scribe for Dr. Artist Lloyd, MD.  Katrina Simmons is a 83 y.o. female who presents to [facility] today for bilat knee pain x three or four years.Had gel injections previously at another facility.  Pt locates pain to knee caps, back of right knee, and top of foot midline. Foot is swollen for some time now.   Knee swelling: yes foot Mechanical symptoms: Aggravates: sleeping, standing, walking Treatments tried: Celebrex  (not helping), OTC topical, pain patches   Pertinent review of systems: ***  Relevant historical information: ***   Exam:  There were no vitals taken for this visit. General: Well Developed, well nourished, and in no acute distress.   MSK: ***    Lab and Radiology Results No results found for this or any previous visit (from the past 72 hours). No results found.     Assessment and Plan: 83 y.o. female with ***   PDMP not reviewed this encounter. No orders of the defined types were placed in this encounter.  No orders of the defined types were placed in this encounter.    Discussed warning signs or symptoms. Please see discharge instructions. Patient expresses understanding.   ***

## 2024-06-06 NOTE — Telephone Encounter (Signed)
 Please check for pre-cert for vasc US  reflux

## 2024-06-06 NOTE — Patient Instructions (Addendum)
 Thank you for coming in today.   Please get an Xray today before you leave   STOP amlodipine   Sock Assist Device  Try wearing compression stockings  You will have a Vascular Ultrasound scheduled at:  [facility] at [facility] [address], [address] [place], [place] [address]

## 2024-06-07 ENCOUNTER — Ambulatory Visit

## 2024-06-07 DIAGNOSIS — R600 Localized edema: Secondary | ICD-10-CM

## 2024-06-07 DIAGNOSIS — L989 Disorder of the skin and subcutaneous tissue, unspecified: Secondary | ICD-10-CM

## 2024-06-07 NOTE — Telephone Encounter (Signed)
No precert required 

## 2024-06-08 NOTE — Progress Notes (Signed)
 Subjective:  Patient ID: Katrina Simmons, female    DOB: 1940-07-31,  MRN: [id number]  Chief Complaint  Patient presents with   Callouses    Rm13 Patient complains of painful callous right foot/ hurts with walking and pressure/ diabetic/ no treatment.    Discussed the use of AI scribe software for clinical note transcription with the patient, who gave verbal consent to proceed.  History of Present Illness Katrina Simmons is an 83 year old female who presents with a painful callus on her foot.  She has a painful plantar callus that has decreased in size but remains hard and causes significant pain with walking and with direct pressure.  She has bilateral lower extremity swelling, worse on the right, with left-sided muscle spasms and significant soreness. She uses pain patches with partial relief. She is scheduled for a DVT doppler this Friday to evaluate her swelling.      Review of Systems: Negative except as noted in the HPI. Denies N/V/F/Ch.  Past Medical History:  Diagnosis Date   Allergy    Arthritis    Diabetes mellitus without complication (HCC)    Diverticulosis    GERD (gastroesophageal reflux disease)    Hiatal hernia    History of colon polyps    Hypercholesteremia    Hyperlipidemia    Hypertension    Hyperthyroidism    Internal hemorrhoids    Lesion of right lobe of liver    Pancreatic cyst    Urinary incontinence     Current Outpatient Medications:    amLODipine  (NORVASC ) 5 MG tablet, Take 1 tablet by mouth once daily, Disp: 90 tablet, Rfl: 0   amLODipine  (NORVASC ) 5 MG tablet, Take 1 tablet (5 mg total) by mouth daily., Disp: 90 tablet, Rfl: 0   aspirin 81 MG tablet, Take 81 mg by mouth daily., Disp: , Rfl:    celecoxib  (CELEBREX ) 100 MG capsule, Take 1 capsule (100 mg total) by mouth 2 (two) times daily., Disp: 180 capsule, Rfl: 3   fluticasone  (FLONASE ) 50 MCG/ACT nasal spray, Place 2 sprays into both nostrils daily., Disp: 48 g, Rfl: 3   glipiZIDE   (GLUCOTROL  XL) 5 MG 24 hr tablet, Take 1 tablet by mouth once daily with breakfast, Disp: 90 tablet, Rfl: 0   hydrocortisone  (PROCTOSOL HC) 2.5 % rectal cream, Place 1 application rectally 2 (two) times daily., Disp: 30 g, Rfl: 11   oxybutynin  (DITROPAN ) 5 MG tablet, Take 1 tablet (5 mg total) by mouth daily as needed for bladder spasms., Disp: 90 tablet, Rfl: 3   simvastatin  (ZOCOR ) 20 MG tablet, TAKE 1 TABLET BY MOUTH IN THE EVENING AT 6PM **APPOINTMENT REQUIRED FOR FUTURE REFILLS**, Disp: 90 tablet, Rfl: 3  Social History   Tobacco Use  Smoking Status Former  Smokeless Tobacco Never    No Known Allergies Objective:   Constitutional Well developed. Well nourished. Oriented to person, place, and time.  Vascular Dorsalis pedis pulses non-palpable bilaterally, may be secondary to edema. Posterior tibial pulses non-palpable bilaterally, may be secondary to edema. Capillary refill normal to all digits.  No cyanosis or clubbing noted. Pedal hair growth normal. Diffuse pitting edema to b/l lower extremities, R>L. Moderate pain to palpation right calf. No pain to palpation left calf.  Neurologic Normal speech. Epicritic sensation to light touch grossly intact bilaterally. Negative tinel sign at tarsal tunnel bilaterally.   Dermatologic Skin texture and turgor are within normal limits.  No open wounds. Plantar right 5th metatarsal head skin lesion with central core.  Musculoskeletal: 5/5 muscle strength to all major pedal muscle groups, no contributing deformity.      Assessment:   1. Benign skin lesion   2. Bilateral edema of lower extremity      Plan:  Patient was evaluated and treated and all questions answered.  Assessment and Plan Assessment & Plan Painful plantar callus or porokeratosis, left foot Painful lesion on left foot, likely plantar callus or porokeratosis. Differential includes wart or porokeratosis. Prefers non-invasive management. - Recommended debridement,  patient refused any physical debridement.  - Applied offloading pad to redistribute weight and alleviate pain. - Recommended urea lotion for tissue breakdown. - Discussed custom orthotics if pad effective.  Bilateral lower extremity pitting edema Significant bilateral pitting edema, more on left. Difficulty palpating pulses. Muscle spasms and pain noted. DVT scan scheduled. - Proceed with DVT scan on Friday. - Continue follow-up with vein and vascular center.   RTC PRN or for orthotic fitting. Offload left 5th MTP.   Prentice Ovens, DPM AACFAS Fellowship Trained Podiatric Surgeon [facility]

## 2024-06-10 ENCOUNTER — Ambulatory Visit (HOSPITAL_COMMUNITY)
Admission: RE | Admit: 2024-06-10 | Discharge: 2024-06-10 | Disposition: A | Discharge status: Home / Self Care | Source: Ambulatory Visit | Attending: Family Medicine | Admitting: Family Medicine

## 2024-06-10 DIAGNOSIS — R600 Localized edema: Secondary | ICD-10-CM | POA: Insufficient documentation

## 2024-06-13 ENCOUNTER — Ambulatory Visit: Payer: Self-pay | Admitting: Family Medicine

## 2024-06-13 NOTE — Progress Notes (Signed)
 Right ankle x-ray shows soft tissue swelling.  No broken bones or severe ankle arthritis

## 2024-06-13 NOTE — Progress Notes (Signed)
Right knee x-ray shows severe arthritis

## 2024-06-13 NOTE — Progress Notes (Signed)
 Left knee x-ray shows severe arthritis.

## 2024-06-14 NOTE — Progress Notes (Signed)
 Venous reflux study shows blood pooling in the leg which would cause the leg swelling.  Compression stockings are generally going to be the most helpful for this.

## 2024-06-16 ENCOUNTER — Telehealth: Payer: Self-pay | Admitting: Family Medicine

## 2024-06-16 NOTE — Telephone Encounter (Signed)
 Pt calling for results of venous ultrasound she had done 06/10/2024.

## 2024-06-16 NOTE — Telephone Encounter (Signed)
 Pt called again and reported she did not answer the prior call because she was on the phone w/ the heart center. I provided her Dr. Virgilio result note and advise. She reports her leg is very painful and swollen. Advised again to get some compression on her legs. She inquired about pulling the fluid off and I said no, not in her case. Again re-iterated the advise. Pt continued to contest. I advised if her conditioned worsened and she needed to seek treatment to get to an ED. Pt said thanks a lot and ended the call.

## 2024-06-16 NOTE — Telephone Encounter (Addendum)
 Patient called and is concerned if the results for her ultrasound had been sent to our office. She was at the heart center and the nurse stated that the results had already been sent to our office. She did not get an answer if they had been faxed here yet and she has been in pain. She talked to the nurse and and they told her that she may have a clot. Patient would like to speak to [place]. She states this is urgent because she is sick. Please advise.

## 2024-06-16 NOTE — Telephone Encounter (Signed)
 Artist Katrina Lloyd, MD 06/14/2024  6:55 AM EST     Venous reflux study shows blood pooling in the leg which would cause the leg swelling.  Compression stockings are generally going to be the most helpful for this.

## 2024-06-16 NOTE — Telephone Encounter (Signed)
 Called and spoke with daughter, Verneita (on [place]) and advised per Dr. Joane.   Verneita states that when they went to [facility], they taught pt would need medical grade compression socks and would need RX telling what size and amount of compression. Would like clarification on whether OTC compression socks or RX compression socks are recommended.   Would also like to know if a follow-up visit is needed.   Forwarding to Dr. Joane to review and advise.

## 2024-06-16 NOTE — Telephone Encounter (Signed)
 Spoke to pt's daughter to make sure she was informed because her mom in continually calling the office. She said they got her some compression stocking and she is unsure why she keeps calling. I advised to please let us  know if they needed anything further.

## 2024-08-02 ENCOUNTER — Encounter: Payer: Self-pay | Admitting: *Deleted

## 2024-08-02 NOTE — Progress Notes (Signed)
 Katrina Simmons                                          MRN: [id number]   08/02/2024   The [facility] Quality Team Specialist reviewed this patient medical record for the purposes of chart review for care gap closure. The following were reviewed: abstraction for care gap closure-kidney health evaluation for diabetes:eGFR  and uACR.    [facility] Quality Team

## 2024-08-12 ENCOUNTER — Other Ambulatory Visit: Payer: Self-pay | Admitting: Internal Medicine

## 2025-01-31 ENCOUNTER — Ambulatory Visit
